# Patient Record
Sex: Female | Born: 1945
Health system: Southern US, Community
[De-identification: ages and names within clinical notes are randomized; demographics above are authoritative.]

## PROBLEM LIST (undated history)

## (undated) DIAGNOSIS — M1990 Unspecified osteoarthritis, unspecified site: Secondary | ICD-10-CM

## (undated) DIAGNOSIS — M8580 Other specified disorders of bone density and structure, unspecified site: Secondary | ICD-10-CM

## (undated) DIAGNOSIS — F419 Anxiety disorder, unspecified: Secondary | ICD-10-CM

## (undated) DIAGNOSIS — I493 Ventricular premature depolarization: Secondary | ICD-10-CM

## (undated) DIAGNOSIS — I10 Essential (primary) hypertension: Secondary | ICD-10-CM

## (undated) DIAGNOSIS — E559 Vitamin D deficiency, unspecified: Secondary | ICD-10-CM

## (undated) DIAGNOSIS — R011 Cardiac murmur, unspecified: Secondary | ICD-10-CM

## (undated) HISTORY — DX: Vitamin D deficiency, unspecified: E55.9

## (undated) HISTORY — DX: Unspecified osteoarthritis, unspecified site: M19.90

## (undated) HISTORY — DX: Cardiac murmur, unspecified: R01.1

## (undated) HISTORY — PX: AUGMENTATION MAMMAPLASTY: SUR837

## (undated) HISTORY — PX: PLACEMENT OF BREAST IMPLANTS: SHX6334

## (undated) HISTORY — DX: Essential (primary) hypertension: I10

## (undated) HISTORY — DX: Ventricular premature depolarization: I49.3

## (undated) HISTORY — DX: Other specified disorders of bone density and structure, unspecified site: M85.80

---

## 1998-02-25 ENCOUNTER — Other Ambulatory Visit: Admission: RE | Admit: 1998-02-25 | Discharge: 1998-02-25 | Payer: Self-pay | Admitting: Obstetrics & Gynecology

## 1998-11-30 ENCOUNTER — Encounter: Payer: Self-pay | Admitting: Obstetrics & Gynecology

## 1998-11-30 ENCOUNTER — Ambulatory Visit (HOSPITAL_COMMUNITY): Admission: RE | Admit: 1998-11-30 | Discharge: 1998-11-30 | Payer: Self-pay | Admitting: Obstetrics & Gynecology

## 1999-03-08 ENCOUNTER — Other Ambulatory Visit: Admission: RE | Admit: 1999-03-08 | Discharge: 1999-03-08 | Payer: Self-pay | Admitting: Obstetrics & Gynecology

## 1999-10-25 HISTORY — PX: OTHER SURGICAL HISTORY: SHX169

## 1999-10-25 HISTORY — PX: FACIAL COSMETIC SURGERY: SHX629

## 1999-12-02 ENCOUNTER — Ambulatory Visit (HOSPITAL_COMMUNITY): Admission: RE | Admit: 1999-12-02 | Discharge: 1999-12-02 | Payer: Self-pay | Admitting: Obstetrics & Gynecology

## 1999-12-02 ENCOUNTER — Encounter: Payer: Self-pay | Admitting: Obstetrics & Gynecology

## 2000-04-14 ENCOUNTER — Other Ambulatory Visit: Admission: RE | Admit: 2000-04-14 | Discharge: 2000-04-14 | Payer: Self-pay | Admitting: Obstetrics & Gynecology

## 2000-06-02 ENCOUNTER — Encounter (INDEPENDENT_AMBULATORY_CARE_PROVIDER_SITE_OTHER): Payer: Self-pay | Admitting: Specialist

## 2000-06-02 ENCOUNTER — Other Ambulatory Visit: Admission: RE | Admit: 2000-06-02 | Discharge: 2000-06-02 | Payer: Self-pay | Admitting: Plastic Surgery

## 2001-03-02 ENCOUNTER — Ambulatory Visit (HOSPITAL_COMMUNITY): Admission: RE | Admit: 2001-03-02 | Discharge: 2001-03-02 | Payer: Self-pay | Admitting: Obstetrics & Gynecology

## 2001-03-02 ENCOUNTER — Encounter: Payer: Self-pay | Admitting: Obstetrics & Gynecology

## 2001-05-09 ENCOUNTER — Other Ambulatory Visit: Admission: RE | Admit: 2001-05-09 | Discharge: 2001-05-09 | Payer: Self-pay | Admitting: Obstetrics & Gynecology

## 2002-03-05 ENCOUNTER — Ambulatory Visit (HOSPITAL_COMMUNITY): Admission: RE | Admit: 2002-03-05 | Discharge: 2002-03-05 | Payer: Self-pay | Admitting: Obstetrics & Gynecology

## 2002-03-05 ENCOUNTER — Encounter: Payer: Self-pay | Admitting: Obstetrics & Gynecology

## 2002-06-05 ENCOUNTER — Other Ambulatory Visit: Admission: RE | Admit: 2002-06-05 | Discharge: 2002-06-05 | Payer: Self-pay | Admitting: Obstetrics & Gynecology

## 2003-03-05 ENCOUNTER — Ambulatory Visit (HOSPITAL_COMMUNITY): Admission: RE | Admit: 2003-03-05 | Discharge: 2003-03-05 | Payer: Self-pay | Admitting: Gastroenterology

## 2003-03-07 ENCOUNTER — Encounter: Payer: Self-pay | Admitting: Obstetrics & Gynecology

## 2003-03-07 ENCOUNTER — Ambulatory Visit (HOSPITAL_COMMUNITY): Admission: RE | Admit: 2003-03-07 | Discharge: 2003-03-07 | Payer: Self-pay | Admitting: Obstetrics & Gynecology

## 2003-03-13 ENCOUNTER — Encounter: Admission: RE | Admit: 2003-03-13 | Discharge: 2003-03-13 | Payer: Self-pay | Admitting: Obstetrics & Gynecology

## 2003-03-13 ENCOUNTER — Encounter: Payer: Self-pay | Admitting: Obstetrics & Gynecology

## 2003-08-04 ENCOUNTER — Other Ambulatory Visit: Admission: RE | Admit: 2003-08-04 | Discharge: 2003-08-04 | Payer: Self-pay | Admitting: Obstetrics & Gynecology

## 2004-04-05 ENCOUNTER — Encounter: Admission: RE | Admit: 2004-04-05 | Discharge: 2004-04-05 | Payer: Self-pay | Admitting: Obstetrics & Gynecology

## 2004-09-06 ENCOUNTER — Other Ambulatory Visit: Admission: RE | Admit: 2004-09-06 | Discharge: 2004-09-06 | Payer: Self-pay | Admitting: Obstetrics & Gynecology

## 2004-11-04 ENCOUNTER — Ambulatory Visit (HOSPITAL_COMMUNITY): Admission: RE | Admit: 2004-11-04 | Discharge: 2004-11-04 | Payer: Self-pay | Admitting: Obstetrics & Gynecology

## 2004-11-04 ENCOUNTER — Encounter (INDEPENDENT_AMBULATORY_CARE_PROVIDER_SITE_OTHER): Payer: Self-pay | Admitting: *Deleted

## 2005-04-07 ENCOUNTER — Encounter: Admission: RE | Admit: 2005-04-07 | Discharge: 2005-04-07 | Payer: Self-pay | Admitting: Obstetrics & Gynecology

## 2005-10-03 ENCOUNTER — Other Ambulatory Visit: Admission: RE | Admit: 2005-10-03 | Discharge: 2005-10-03 | Payer: Self-pay | Admitting: Obstetrics & Gynecology

## 2006-04-12 ENCOUNTER — Encounter: Admission: RE | Admit: 2006-04-12 | Discharge: 2006-04-12 | Payer: Self-pay | Admitting: Obstetrics & Gynecology

## 2007-05-17 ENCOUNTER — Encounter: Admission: RE | Admit: 2007-05-17 | Discharge: 2007-05-17 | Payer: Self-pay | Admitting: Obstetrics & Gynecology

## 2008-05-19 ENCOUNTER — Encounter: Admission: RE | Admit: 2008-05-19 | Discharge: 2008-05-19 | Payer: Self-pay | Admitting: Obstetrics & Gynecology

## 2009-05-21 ENCOUNTER — Encounter: Admission: RE | Admit: 2009-05-21 | Discharge: 2009-05-21 | Payer: Self-pay | Admitting: Obstetrics & Gynecology

## 2010-11-04 ENCOUNTER — Encounter
Admission: RE | Admit: 2010-11-04 | Discharge: 2010-11-04 | Payer: Self-pay | Source: Home / Self Care | Attending: Obstetrics & Gynecology | Admitting: Obstetrics & Gynecology

## 2010-11-14 ENCOUNTER — Encounter: Payer: Self-pay | Admitting: Obstetrics & Gynecology

## 2011-03-11 NOTE — Op Note (Signed)
   NAME:  Karina Morris, Karina Morris                          ACCOUNT NO.:  [id number]   MEDICAL RECORD NO.:  [id number]                   PATIENT TYPE:  AMB   LOCATION:  ENDO                                 FACILITY:  [facility]   PHYSICIAN:  Graylin Shiver, M.D.                DATE OF BIRTH:  07-Feb-1946   DATE OF PROCEDURE:  DATE OF DISCHARGE:                                 OPERATIVE REPORT   Colonoscopy   INDICATIONS FOR PROCEDURE:  Screening for colon cancer.   Informed consent was obtained.   PREMEDICATION:  1. Fentanyl 100 mcg IV  2. Versed 10 mg IV, total dose given during the procedure.   PROCEDURE:  With the patient in the left lateral decubitus position, a  rectal exam was performed.  No masses were felt.  Some small external  hemorrhoidal tags were noted.  The Olympus colonoscope was inserted into the  rectum and advanced around the colon to the cecum.  Cecal landmarks were  identified.  The cecum looked normal.  There was an occasional diverticulum  noted in the ascending colon and transverse colon; otherwise, no  abnormalities were seen.  Moderate diverticulosis was noted in the  descending colon and sigmoid.  The rectum was normal.  She tolerated the  procedure well without complications.   IMPRESSION:  Diverticulosis.   Patient was returned to the recovery area at end of endoscopy.  I would  recommend a followup screening colonoscopy again in 10 years.                                               Graylin Shiver, M.D.    SFG/MEDQ  D:  03/05/2003  T:  03/06/2003  Job:  [id number]   cc:   Duncan Dull, M.D.  [address]  [place]  [place] [address]  Fax: [phone]

## 2011-03-11 NOTE — Op Note (Signed)
NAME:  Karina Morris, Karina Morris NO.:  [id number]   MEDICAL RECORD NO.:  [id number]          PATIENT TYPE:  AMB   LOCATION:  SDC                           FACILITY:  [facility]   PHYSICIAN:  Freddy Finner, M.D.   DATE OF BIRTH:  1946/03/25   DATE OF PROCEDURE:  DATE OF DISCHARGE:                                 OPERATIVE REPORT   PREOPERATIVE DIAGNOSIS:  Postmenopausal bleeding, endometrial polyp.   POSTOPERATIVE DIAGNOSIS:  Postmenopausal bleeding, endometrial polyp, with a  fundal polyp and two webs of tissue consistent with possible intracavitary  scarring.   OPERATIVE PROCEDURE:  Hysteroscopy, D&C, with resection of scarring and  polyp.   SURGEON:  Freddy Finner, M.D.   ANESTHESIA:  Managed intravenous sedation and paracervical block.   INTRAOPERATIVE COMPLICATIONS:  None.   ESTIMATED INTRAOPERATIVE BLOOD LOSS:  Less than or equal to 10 cc.   INTRAOPERATIVE SORBITOL DEFICIT:  30 cc.   The patient is a 65 year old who had an episode of postmenopausal bleeding  and had a sonohysterogram in the office showing an endometrial polyp.  She  was admitted at this time for hysteroscopy and D&C.  She was admitted on the  morning of surgery.  She was brought to the operating room and there placed  under adequate intravenous sedation and placed in the dorsal lithotomy  position.  Betadine prep was carried out in the usual fashion.  Sterile  drapes were applied.  Bivalve speculum was introduced.  Cervix was  visualized and grasped on the anterior lip with a single-tooth tenaculum.  Paracervical block was placed using 10 cc of 1% plain Xylocaine.  Injections  were made at 4 and 8 o'clock in the vaginal fornices.  Cervix was  progressively dilated to 23 with Pratts.  Uterus and cervix sounded to 8.5  cm.  The 12-1/2 degree _________ hysteroscope was introduced, and  visualization revealed findings as noted above.  These findings are recorded  in still photographs which are retained  in the office record.  Gentle  thorough curettage was carried out, followed by exploration with Randall  stone forceps.  Repeat inspection revealed near complete resection of the  lesions noted above.  A small area was still remaining on the right.  This  was recuretted with success.  After completing the procedure, reinspection  was carried out.  The procedure was then terminated.  All instruments were  removed, and the patient was taken to the recovery room in good condition.  She was given Darvocet to be taken as needed for postoperative pain  unrelieved by ibuprofen or Tylenol.  She was given 30 mg of Toradol IV and  30 IM immediately after conclusion of the procedure.  She was to be given  routine outpatient surgical instructions, and is to follow up in the office  in 7-10 days.     Hosie Spangle  WRN/MEDQ  D:  11/04/2004  T:  11/04/2004  Job:  [id number]

## 2011-10-12 ENCOUNTER — Other Ambulatory Visit: Payer: Self-pay | Admitting: Obstetrics & Gynecology

## 2011-10-12 DIAGNOSIS — Z1231 Encounter for screening mammogram for malignant neoplasm of breast: Secondary | ICD-10-CM

## 2011-11-07 ENCOUNTER — Ambulatory Visit
Admission: RE | Admit: 2011-11-07 | Discharge: 2011-11-07 | Disposition: A | Discharge status: Home / Self Care | Payer: Medicare Other | Source: Ambulatory Visit | Attending: Obstetrics & Gynecology | Admitting: Obstetrics & Gynecology

## 2011-11-07 DIAGNOSIS — Z1231 Encounter for screening mammogram for malignant neoplasm of breast: Secondary | ICD-10-CM

## 2011-12-12 DIAGNOSIS — M810 Age-related osteoporosis without current pathological fracture: Secondary | ICD-10-CM | POA: Diagnosis not present

## 2011-12-12 DIAGNOSIS — N951 Menopausal and female climacteric states: Secondary | ICD-10-CM | POA: Diagnosis not present

## 2011-12-12 DIAGNOSIS — Z130 Encounter for screening for diseases of the blood and blood-forming organs and certain disorders involving the immune mechanism: Secondary | ICD-10-CM | POA: Diagnosis not present

## 2011-12-12 DIAGNOSIS — Z124 Encounter for screening for malignant neoplasm of cervix: Secondary | ICD-10-CM | POA: Diagnosis not present

## 2012-01-23 ENCOUNTER — Encounter: Payer: Self-pay | Admitting: Internal Medicine

## 2012-01-24 ENCOUNTER — Encounter: Payer: Self-pay | Admitting: Internal Medicine

## 2012-03-02 ENCOUNTER — Other Ambulatory Visit: Payer: Self-pay | Admitting: Internal Medicine

## 2012-03-02 ENCOUNTER — Other Ambulatory Visit: Payer: Medicare Other | Admitting: Internal Medicine

## 2012-03-13 ENCOUNTER — Other Ambulatory Visit: Payer: Medicare Other | Admitting: Internal Medicine

## 2012-07-24 DIAGNOSIS — Z23 Encounter for immunization: Secondary | ICD-10-CM | POA: Diagnosis not present

## 2012-09-03 DIAGNOSIS — S61209A Unspecified open wound of unspecified finger without damage to nail, initial encounter: Secondary | ICD-10-CM | POA: Diagnosis not present

## 2012-09-03 DIAGNOSIS — L089 Local infection of the skin and subcutaneous tissue, unspecified: Secondary | ICD-10-CM | POA: Diagnosis not present

## 2012-09-03 DIAGNOSIS — S60469A Insect bite (nonvenomous) of unspecified finger, initial encounter: Secondary | ICD-10-CM | POA: Diagnosis not present

## 2012-10-02 ENCOUNTER — Other Ambulatory Visit: Payer: Self-pay | Admitting: Obstetrics & Gynecology

## 2012-10-02 DIAGNOSIS — Z9882 Breast implant status: Secondary | ICD-10-CM

## 2012-10-02 DIAGNOSIS — Z1231 Encounter for screening mammogram for malignant neoplasm of breast: Secondary | ICD-10-CM

## 2012-10-22 ENCOUNTER — Encounter: Payer: Self-pay | Admitting: Internal Medicine

## 2012-10-29 DIAGNOSIS — H2510 Age-related nuclear cataract, unspecified eye: Secondary | ICD-10-CM | POA: Diagnosis not present

## 2012-11-09 ENCOUNTER — Ambulatory Visit (AMBULATORY_SURGERY_CENTER): Payer: Medicare Other | Admitting: *Deleted

## 2012-11-09 VITALS — Ht 63.5 in | Wt 160.2 lb

## 2012-11-09 DIAGNOSIS — Z1211 Encounter for screening for malignant neoplasm of colon: Secondary | ICD-10-CM

## 2012-11-09 MED ORDER — NA SULFATE-K SULFATE-MG SULF 17.5-3.13-1.6 GM/177ML PO SOLN: ORAL | Status: DC

## 2012-11-13 ENCOUNTER — Ambulatory Visit
Admission: RE | Admit: 2012-11-13 | Discharge: 2012-11-13 | Disposition: A | Discharge status: Home / Self Care | Payer: Medicare Other | Source: Ambulatory Visit | Attending: Obstetrics & Gynecology | Admitting: Obstetrics & Gynecology

## 2012-11-13 DIAGNOSIS — Z9882 Breast implant status: Secondary | ICD-10-CM

## 2012-11-13 DIAGNOSIS — Z1231 Encounter for screening mammogram for malignant neoplasm of breast: Secondary | ICD-10-CM

## 2012-11-23 ENCOUNTER — Ambulatory Visit (AMBULATORY_SURGERY_CENTER): Payer: Medicare Other | Admitting: Internal Medicine

## 2012-11-23 ENCOUNTER — Encounter: Payer: Self-pay | Admitting: Internal Medicine

## 2012-11-23 VITALS — BP 159/65 | HR 79 | Temp 98.2°F | Resp 15 | Ht 63.5 in | Wt 160.0 lb

## 2012-11-23 DIAGNOSIS — K5730 Diverticulosis of large intestine without perforation or abscess without bleeding: Secondary | ICD-10-CM

## 2012-11-23 DIAGNOSIS — Z1211 Encounter for screening for malignant neoplasm of colon: Secondary | ICD-10-CM

## 2012-11-23 MED ORDER — SODIUM CHLORIDE 0.9 % IV SOLN: 500.0000 mL | INTRAVENOUS | Status: DC

## 2012-11-23 NOTE — Progress Notes (Signed)
Patient did not experience any of the following events: a burn prior to discharge; a fall within the facility; wrong site/side/patient/procedure/implant event; or a hospital transfer or hospital admission upon discharge from the facility. (G8907) Patient did not have preoperative order for IV antibiotic SSI prophylaxis. (G8918)  

## 2012-11-23 NOTE — Patient Instructions (Addendum)
No polyps or cancer seen. You do have diverticulosis throughout the colon - this usually does not cause problems for people and is not something that pre-disposes to cancer.  You can consider a repeat colonoscopy in 10 years (2024).  Thank you for choosing me and [facility].  Iva Boop, MD, [facility]     You may resume your current medications today.  Please call if any questions or concerns.   YOU HAD AN ENDOSCOPIC PROCEDURE TODAY AT THE [facility]: Refer to the procedure report that was given to you for any specific questions about what was found during the examination.  If the procedure report does not answer your questions, please call your gastroenterologist to clarify.  If you requested that your care partner not be given the details of your procedure findings, then the procedure report has been included in a sealed envelope for you to review at your convenience later.  YOU SHOULD EXPECT: Some feelings of bloating in the abdomen. Passage of more gas than usual.  Walking can help get rid of the air that was put into your GI tract during the procedure and reduce the bloating. If you had a lower endoscopy (such as a colonoscopy or flexible sigmoidoscopy) you may notice spotting of blood in your stool or on the toilet paper. If you underwent a bowel prep for your procedure, then you may not have a normal bowel movement for a few days.  DIET: Your first meal following the procedure should be a light meal and then it is ok to progress to your normal diet.  A half-sandwich or bowl of soup is an example of a good first meal.  Heavy or fried foods are harder to digest and may make you feel nauseous or bloated.  Likewise meals heavy in dairy and vegetables can cause extra gas to form and this can also increase the bloating.  Drink plenty of fluids but you should avoid alcoholic beverages for 24 hours.  ACTIVITY: Your care partner should take you home directly after the  procedure.  You should plan to take it easy, moving slowly for the rest of the day.  You can resume normal activity the day after the procedure however you should NOT DRIVE or use heavy machinery for 24 hours (because of the sedation medicines used during the test).    SYMPTOMS TO REPORT IMMEDIATELY: A gastroenterologist can be reached at any hour.  During normal business hours, 8:30 AM to 5:00 PM Monday through Friday, call [phone].  After hours and on weekends, please call the GI answering service at [phone] who will take a message and have the physician on call contact you.   Following lower endoscopy (colonoscopy or flexible sigmoidoscopy):  Excessive amounts of blood in the stool  Significant tenderness or worsening of abdominal pains  Swelling of the abdomen that is new, acute  Fever of 100F or higher    FOLLOW UP: If any biopsies were taken you will be contacted by phone or by letter within the next 1-3 weeks.  Call your gastroenterologist if you have not heard about the biopsies in 3 weeks.  Our staff will call the home number listed on your records the next business day following your procedure to check on you and address any questions or concerns that you may have at that time regarding the information given to you following your procedure. This is a courtesy call and so if there is no answer at the home  number and we have not heard from you through the emergency physician on call, we will assume that you have returned to your regular daily activities without incident.  SIGNATURES/CONFIDENTIALITY: You and/or your care partner have signed paperwork which will be entered into your electronic medical record.  These signatures attest to the fact that that the information above on your After Visit Summary has been reviewed and is understood.  Full responsibility of the confidentiality of this discharge information lies with you and/or your care-partner.

## 2012-11-23 NOTE — Progress Notes (Signed)
Pt stable to RR 

## 2012-11-23 NOTE — Op Note (Signed)
[facility] [address] [place], [address]   COLONOSCOPY PROCEDURE REPORT  PATIENT: Karina Morris, Karina Morris  MR#: [id number] BIRTHDATE: Sep 26, 1946 , 66  yrs. old GENDER: Female ENDOSCOPIST: Iva Boop, MD, [facility] REFERRED AO:ZHYQMV Jennette Kettle, M.D. PROCEDURE DATE:  11/23/2012 PROCEDURE:   Colonoscopy, screening ASA CLASS:   Class I INDICATIONS:average risk screening. MEDICATIONS: propofol (Diprivan) 300mg  IV, MAC sedation, administered by CRNA, and These medications were titrated to patient response per physician's verbal order  DESCRIPTION OF PROCEDURE:   After the risks benefits and alternatives of the procedure were thoroughly explained, informed consent was obtained.  A digital rectal exam revealed no abnormalities of the rectum.   The LB [id number]-[id number]  endoscope was introduced through the anus and advanced to the cecum, which was identified by both the appendix and ileocecal valve. No adverse events experienced.   The quality of the prep was Suprep excellent The instrument was then slowly withdrawn as the colon was fully examined.      COLON FINDINGS: There was severe diverticulosis noted throughout the entire examined colon with associated muscular hypertrophy and luminal narrowing.   The colon mucosa was otherwise normal.   A right colon retroflexion was performed.  Retroflexed views revealed no abnormalities. The time to cecum=4 minutes 14 seconds. Withdrawal time=7 minutes 08 seconds.  The scope was withdrawn and the procedure completed. COMPLICATIONS: There were no complications.  ENDOSCOPIC IMPRESSION: 1.   There was severe diverticulosis noted throughout the entire examined colon 2.   Normal colonoscopy otherwise - excellent prep  RECOMMENDATIONS: Consider repeat Colonscopy in 10 years at age 78 - no automatic recall - can discuss with PCP   eSigned:  Iva Boop, MD, [facility] 11/23/2012 11:43 AM   cc: Varney Baas, MD and The  Patient

## 2012-11-23 NOTE — Progress Notes (Signed)
No complaints noted in the recovery room. Maw   

## 2012-11-23 NOTE — Progress Notes (Signed)
No egg or soy allergy. ewm 

## 2012-11-26 ENCOUNTER — Telehealth: Payer: Self-pay | Admitting: *Deleted

## 2012-11-26 NOTE — Telephone Encounter (Signed)
  Follow up Call-  Call back number 11/23/2012  Post procedure Call Back phone  # [phone]  Permission to leave phone message Yes     Patient questions:  Do you have a fever, pain , or abdominal swelling? no Pain Score  0 *  Have you tolerated food without any problems? yes  Have you been able to return to your normal activities? yes  Do you have any questions about your discharge instructions: Diet   no Medications  no Follow up visit  no  Do you have questions or concerns about your Care? no  Actions: * If pain score is 4 or above: No action needed, pain <4.

## 2012-12-31 DIAGNOSIS — Z130 Encounter for screening for diseases of the blood and blood-forming organs and certain disorders involving the immune mechanism: Secondary | ICD-10-CM | POA: Diagnosis not present

## 2012-12-31 DIAGNOSIS — Z01419 Encounter for gynecological examination (general) (routine) without abnormal findings: Secondary | ICD-10-CM | POA: Diagnosis not present

## 2013-03-17 ENCOUNTER — Emergency Department (HOSPITAL_COMMUNITY)
Admission: EM | Admit: 2013-03-17 | Discharge: 2013-03-17 | Disposition: A | Discharge status: Home / Self Care | Payer: Medicare Other | Attending: Emergency Medicine | Admitting: Emergency Medicine

## 2013-03-17 ENCOUNTER — Encounter (HOSPITAL_COMMUNITY): Payer: Self-pay | Admitting: Emergency Medicine

## 2013-03-17 ENCOUNTER — Emergency Department (HOSPITAL_COMMUNITY): Payer: Medicare Other

## 2013-03-17 DIAGNOSIS — R109 Unspecified abdominal pain: Secondary | ICD-10-CM | POA: Diagnosis not present

## 2013-03-17 DIAGNOSIS — Z79899 Other long term (current) drug therapy: Secondary | ICD-10-CM | POA: Insufficient documentation

## 2013-03-17 DIAGNOSIS — M129 Arthropathy, unspecified: Secondary | ICD-10-CM | POA: Insufficient documentation

## 2013-03-17 DIAGNOSIS — N201 Calculus of ureter: Secondary | ICD-10-CM | POA: Diagnosis not present

## 2013-03-17 DIAGNOSIS — Z8719 Personal history of other diseases of the digestive system: Secondary | ICD-10-CM | POA: Diagnosis not present

## 2013-03-17 DIAGNOSIS — K8020 Calculus of gallbladder without cholecystitis without obstruction: Secondary | ICD-10-CM | POA: Diagnosis not present

## 2013-03-17 DIAGNOSIS — N200 Calculus of kidney: Secondary | ICD-10-CM | POA: Diagnosis not present

## 2013-03-17 LAB — CBC WITH DIFFERENTIAL/PLATELET
Basophils Absolute: 0 10*3/uL (ref 0.0–0.1)
Basophils Relative: 0 % (ref 0–1)
HCT: 36.7 % (ref 36.0–46.0)
Lymphocytes Relative: 20 % (ref 12–46)
MCHC: 34.1 g/dL (ref 30.0–36.0)
Monocytes Absolute: 0.5 10*3/uL (ref 0.1–1.0)
Neutro Abs: 6.5 10*3/uL (ref 1.7–7.7)
Neutrophils Relative %: 74 % (ref 43–77)
Platelets: 185 10*3/uL (ref 150–400)
RDW: 13.3 % (ref 11.5–15.5)
WBC: 8.8 10*3/uL (ref 4.0–10.5)

## 2013-03-17 LAB — URINALYSIS, ROUTINE W REFLEX MICROSCOPIC
Nitrite: NEGATIVE
Urobilinogen, UA: 0.2 mg/dL (ref 0.0–1.0)
pH: 5.5 (ref 5.0–8.0)

## 2013-03-17 LAB — POCT I-STAT, CHEM 8
BUN: 24 mg/dL — ABNORMAL HIGH (ref 6–23)
Calcium, Ion: 1.21 mmol/L (ref 1.13–1.30)
Chloride: 108 mEq/L (ref 96–112)
HCT: 39 % (ref 36.0–46.0)
Potassium: 3.8 mEq/L (ref 3.5–5.1)
Sodium: 141 mEq/L (ref 135–145)

## 2013-03-17 LAB — URINE MICROSCOPIC-ADD ON

## 2013-03-17 MED ORDER — TAMSULOSIN HCL 0.4 MG PO CAPS: 0.4000 mg | ORAL_CAPSULE | Freq: Once | ORAL | Status: DC

## 2013-03-17 MED ORDER — HYDROCODONE-ACETAMINOPHEN 5-325 MG PO TABS: 1.0000 | ORAL_TABLET | Freq: Four times a day (QID) | ORAL | Status: DC | PRN

## 2013-03-17 MED ORDER — MORPHINE SULFATE 4 MG/ML IJ SOLN
4.0000 mg | INTRAMUSCULAR | Status: DC | PRN
  Administered 2013-03-17: 4 mg via INTRAVENOUS
  Filled 2013-03-17 (×2): qty 1

## 2013-03-17 MED ORDER — ONDANSETRON HCL 4 MG/2ML IJ SOLN
4.0000 mg | Freq: Four times a day (QID) | INTRAMUSCULAR | Status: DC | PRN
  Administered 2013-03-17: 4 mg via INTRAVENOUS
  Filled 2013-03-17 (×2): qty 2

## 2013-03-17 MED ORDER — ONDANSETRON 8 MG PO TBDP: 8.0000 mg | ORAL_TABLET | Freq: Three times a day (TID) | ORAL | Status: DC | PRN

## 2013-03-17 NOTE — ED Provider Notes (Signed)
History     CSN: [id number]  Arrival date & time 03/17/13  1725   First MD Initiated Contact with Patient 03/17/13 1750      Chief Complaint  Patient presents with  . Flank Pain    (Consider location/radiation/quality/duration/timing/severity/associated sxs/prior treatment) HPI Comments: Pt comes in with cc of flank pain. Pt has no hx of renal stones. States that patient started having pain on the left flank side yday. The pain is sharp, and at par to her child bearing. The pain is intermittent, and non radiating. There is no n/v/f/c. No vaginal bleeding, discharge. Pt has hx of diverticular dz - but no anterior pain.  Patient is a 67 y.o. female presenting with flank pain. The history is provided by the patient.  Flank Pain Pertinent negatives include no chest pain, no abdominal pain and no shortness of breath.    Past Medical History  Diagnosis Date  . Arthritis     neck    Past Surgical History  Procedure Laterality Date  . Facial cosmetic surgery  2001  . Bilateral breast reduction  2001    Family History  Problem Relation Age of Onset  . Colon cancer Neg Hx   . Stomach cancer Neg Hx     History  Substance Use Topics  . Smoking status: Never Smoker   . Smokeless tobacco: Never Used  . Alcohol Use: 0.6 oz/week    1 Glasses of wine per week    OB History   Grav Para Term Preterm Abortions TAB SAB Ect Mult Living                  Review of Systems  Constitutional: Negative for activity change.  HENT: Negative for facial swelling and neck pain.   Respiratory: Negative for cough, shortness of breath and wheezing.   Cardiovascular: Negative for chest pain.  Gastrointestinal: Negative for nausea, vomiting, abdominal pain, diarrhea, constipation, blood in stool and abdominal distention.  Genitourinary: Positive for flank pain. Negative for hematuria and difficulty urinating.  Skin: Negative for color change.  Neurological: Negative for speech difficulty.   Hematological: Does not bruise/bleed easily.  Psychiatric/Behavioral: Negative for confusion.    Allergies  Review of patient's allergies indicates no known allergies.  Home Medications   Current Outpatient Rx  Name  Route  Sig  Dispense  Refill  . calcium carbonate (OS-CAL) 600 MG TABS   Oral   Take 600 mg by mouth daily.         . Cholecalciferol (VITAMIN D) 2000 UNITS CAPS   Oral   Take 1 capsule by mouth daily.          Marland Kitchen estrogens, conjugated, (PREMARIN) 0.45 MG tablet   Oral   Take 0.45 mg by mouth daily. Continuous over 30 days         . Lysine 500 MG CAPS   Oral   Take 1 capsule by mouth daily.         . progesterone (PROMETRIUM) 200 MG capsule   Oral   Take 200 mg by mouth daily. Take 1 tablet daily for 12 days once every 3 months, on days 1st -12 th of the month         . zolpidem (AMBIEN) 10 MG tablet   Oral   Take 10 mg by mouth at bedtime as needed for sleep.           BP 177/65  Pulse 109  Temp(Src) 98.5 F (36.9 C) (Oral)  SpO2 100%  Physical Exam  Constitutional: She is oriented to person, place, and time. She appears well-developed and well-nourished.  HENT:  Head: Normocephalic and atraumatic.  Eyes: EOM are normal. Pupils are equal, round, and reactive to light.  Neck: Neck supple.  Cardiovascular: Normal rate, regular rhythm and normal heart sounds.   No murmur heard. Pulmonary/Chest: Effort normal. No respiratory distress.  Abdominal: Soft. She exhibits no distension. There is no tenderness. There is no rebound and no guarding.  Neurological: She is alert and oriented to person, place, and time.  Skin: Skin is warm and dry.    ED Course  Procedures (including critical care time)  Labs Reviewed  URINALYSIS, ROUTINE W REFLEX MICROSCOPIC - Abnormal; Notable for the following:    APPearance CLOUDY (*)    Hgb urine dipstick SMALL (*)    Leukocytes, UA SMALL (*)    All other components within normal limits  URINE  MICROSCOPIC-ADD ON - Abnormal; Notable for the following:    Bacteria, UA FEW (*)    All other components within normal limits  POCT I-STAT, CHEM 8 - Abnormal; Notable for the following:    BUN 24 (*)    Creatinine, Ser 1.60 (*)    Glucose, Bld 109 (*)    All other components within normal limits  CBC WITH DIFFERENTIAL   Ct Abdomen Pelvis Wo Contrast  03/17/2013   *RADIOLOGY REPORT*  Clinical Data: Left flank pain.  Pain in the perineum.  Pain comes and goes.  Urge to urinate.  CT ABDOMEN AND PELVIS WITHOUT CONTRAST  Technique:  Multidetector CT imaging of the abdomen and pelvis was performed following the standard protocol without intravenous contrast.  Comparison: None.  Findings: The patient has bilateral breast implants.  Lung bases are unremarkable.  The within the distal left ureter, there are numerous small stones, largest measuring approximately 4 mm in diameter.  The appearance is known as "steinstrasse".  There is resulting mild left hydronephrosis and perinephric stranding.  Small intrarenal calculi are also identified in the left kidney, largest measuring approximate 4 mm in diameter.  On the right, there is an extrarenal pelvis, containing 2 calculi, 8 and 5 mm each.  However, these do not appear be causing obstruction.  The liver is fatty without obvious focal lesion.  No focal abnormality identified within the spleen, pancreas, or adrenal glands.  The gallbladder is present, containing high attenuation material consistent with stones.  The stomach and small bowel loops are normal appearance. The appendix is well seen and has a normal appearance.  Throughout the colon, there are numerous diverticula.  No definite evidence for acute diverticulitis.  The uterus is present.  Left adnexal low attenuation lesion it is 3.9 x 3.4 cm.  Further evaluation with pelvic ultrasound is suggested for characterization.  No free pelvic fluid or pelvic adenopathy.  Mild degenerative changes are seen in the  lower lumbar spine.  No suspicious lytic or blastic lesions are identified.  IMPRESSION:  1.  Multiple stones in the distal left ureter, measuring up to 4 mm in greatest diameter. 2.  Nonobstructing stones within the right extrarenal pelvis. 3.  Intrarenal stones in the left kidney. 4.  Left adnexal low density lesion, 3.9 cm warrants further evaluation with pelvic ultrasound. This could be performed on a non emergent, outpatient basis as needed.  5.  Gallstones. 6.  Diverticulosis.   Original Report Authenticated By: Norva Pavlov, M.D.     No diagnosis found.    MDM  Pt comes in with cc of intermittent flank pain. Clinical hx is suggestive of non-obstructive renal stones - but diverticulitis is also possible and so is pyelonephritis.  Will get CT renal stone protocol. Will treat the sx.  8:26 PM Pt is pain free. CT results shared. Asked her to get non emergent [place] as per recs. Urology f/u recommended, Return precautions discussed.   Derwood Kaplan, MD 03/17/13 2026

## 2013-03-17 NOTE — ED Notes (Signed)
Pt states that Friday she was cleaning out her garage and afterwards she started having L flank pain. States that she also had a pain in her perineum area. Pain has come and gone and she has also had urge to pee with out ability to do so. States she went and got overactive bladder 'patch' from [facility] that helped her pass urine. No hx of kidney stones or bladder problems.

## 2013-06-03 DIAGNOSIS — N949 Unspecified condition associated with female genital organs and menstrual cycle: Secondary | ICD-10-CM | POA: Diagnosis not present

## 2013-07-22 DIAGNOSIS — Z23 Encounter for immunization: Secondary | ICD-10-CM | POA: Diagnosis not present

## 2013-11-04 ENCOUNTER — Other Ambulatory Visit: Payer: Self-pay

## 2013-11-04 DIAGNOSIS — Z1231 Encounter for screening mammogram for malignant neoplasm of breast: Secondary | ICD-10-CM

## 2013-11-15 ENCOUNTER — Other Ambulatory Visit: Payer: Self-pay

## 2013-11-15 DIAGNOSIS — Z9882 Breast implant status: Secondary | ICD-10-CM

## 2013-11-15 DIAGNOSIS — Z1231 Encounter for screening mammogram for malignant neoplasm of breast: Secondary | ICD-10-CM

## 2013-11-28 ENCOUNTER — Ambulatory Visit
Admission: RE | Admit: 2013-11-28 | Discharge: 2013-11-28 | Disposition: A | Discharge status: Home / Self Care | Payer: Medicare Other | Source: Ambulatory Visit

## 2013-11-28 DIAGNOSIS — Z1231 Encounter for screening mammogram for malignant neoplasm of breast: Secondary | ICD-10-CM

## 2013-11-28 DIAGNOSIS — Z9882 Breast implant status: Secondary | ICD-10-CM

## 2014-02-21 DIAGNOSIS — Z0000 Encounter for general adult medical examination without abnormal findings: Secondary | ICD-10-CM | POA: Diagnosis not present

## 2014-02-21 DIAGNOSIS — E559 Vitamin D deficiency, unspecified: Secondary | ICD-10-CM | POA: Diagnosis not present

## 2014-02-21 DIAGNOSIS — M899 Disorder of bone, unspecified: Secondary | ICD-10-CM | POA: Diagnosis not present

## 2014-02-21 DIAGNOSIS — M949 Disorder of cartilage, unspecified: Secondary | ICD-10-CM | POA: Diagnosis not present

## 2014-02-21 DIAGNOSIS — Z136 Encounter for screening for cardiovascular disorders: Secondary | ICD-10-CM | POA: Diagnosis not present

## 2014-02-21 DIAGNOSIS — M545 Low back pain, unspecified: Secondary | ICD-10-CM | POA: Diagnosis not present

## 2014-02-21 DIAGNOSIS — Z131 Encounter for screening for diabetes mellitus: Secondary | ICD-10-CM | POA: Diagnosis not present

## 2014-03-24 DIAGNOSIS — Z01419 Encounter for gynecological examination (general) (routine) without abnormal findings: Secondary | ICD-10-CM | POA: Diagnosis not present

## 2014-03-24 DIAGNOSIS — Z124 Encounter for screening for malignant neoplasm of cervix: Secondary | ICD-10-CM | POA: Diagnosis not present

## 2014-08-04 DIAGNOSIS — Z23 Encounter for immunization: Secondary | ICD-10-CM | POA: Diagnosis not present

## 2014-09-03 DIAGNOSIS — L309 Dermatitis, unspecified: Secondary | ICD-10-CM | POA: Diagnosis not present

## 2014-09-03 DIAGNOSIS — D485 Neoplasm of uncertain behavior of skin: Secondary | ICD-10-CM | POA: Diagnosis not present

## 2014-10-08 DIAGNOSIS — J069 Acute upper respiratory infection, unspecified: Secondary | ICD-10-CM | POA: Diagnosis not present

## 2014-10-08 DIAGNOSIS — Z1389 Encounter for screening for other disorder: Secondary | ICD-10-CM | POA: Diagnosis not present

## 2014-10-08 DIAGNOSIS — H6980 Other specified disorders of Eustachian tube, unspecified ear: Secondary | ICD-10-CM | POA: Diagnosis not present

## 2014-11-10 ENCOUNTER — Other Ambulatory Visit: Payer: Self-pay

## 2014-11-10 DIAGNOSIS — Z1231 Encounter for screening mammogram for malignant neoplasm of breast: Secondary | ICD-10-CM

## 2014-12-01 ENCOUNTER — Ambulatory Visit
Admission: RE | Admit: 2014-12-01 | Discharge: 2014-12-01 | Disposition: A | Discharge status: Home / Self Care | Payer: Medicare Other | Source: Ambulatory Visit

## 2014-12-01 DIAGNOSIS — Z1231 Encounter for screening mammogram for malignant neoplasm of breast: Secondary | ICD-10-CM

## 2014-12-03 ENCOUNTER — Other Ambulatory Visit: Payer: Self-pay | Admitting: Obstetrics & Gynecology

## 2014-12-03 DIAGNOSIS — R928 Other abnormal and inconclusive findings on diagnostic imaging of breast: Secondary | ICD-10-CM

## 2014-12-10 ENCOUNTER — Ambulatory Visit
Admission: RE | Admit: 2014-12-10 | Discharge: 2014-12-10 | Disposition: A | Discharge status: Home / Self Care | Payer: Medicare Other | Source: Ambulatory Visit | Attending: Obstetrics & Gynecology | Admitting: Obstetrics & Gynecology

## 2014-12-10 DIAGNOSIS — R928 Other abnormal and inconclusive findings on diagnostic imaging of breast: Secondary | ICD-10-CM

## 2014-12-10 DIAGNOSIS — R921 Mammographic calcification found on diagnostic imaging of breast: Secondary | ICD-10-CM | POA: Diagnosis not present

## 2015-02-25 DIAGNOSIS — Z1389 Encounter for screening for other disorder: Secondary | ICD-10-CM | POA: Diagnosis not present

## 2015-02-25 DIAGNOSIS — S46919A Strain of unspecified muscle, fascia and tendon at shoulder and upper arm level, unspecified arm, initial encounter: Secondary | ICD-10-CM | POA: Diagnosis not present

## 2015-02-25 DIAGNOSIS — Z23 Encounter for immunization: Secondary | ICD-10-CM | POA: Diagnosis not present

## 2015-02-25 DIAGNOSIS — Z13220 Encounter for screening for lipoid disorders: Secondary | ICD-10-CM | POA: Diagnosis not present

## 2015-02-25 DIAGNOSIS — Z0000 Encounter for general adult medical examination without abnormal findings: Secondary | ICD-10-CM | POA: Diagnosis not present

## 2015-02-25 DIAGNOSIS — Z131 Encounter for screening for diabetes mellitus: Secondary | ICD-10-CM | POA: Diagnosis not present

## 2015-02-25 DIAGNOSIS — Z136 Encounter for screening for cardiovascular disorders: Secondary | ICD-10-CM | POA: Diagnosis not present

## 2015-03-30 DIAGNOSIS — Z01419 Encounter for gynecological examination (general) (routine) without abnormal findings: Secondary | ICD-10-CM | POA: Diagnosis not present

## 2015-03-30 DIAGNOSIS — Z6827 Body mass index (BMI) 27.0-27.9, adult: Secondary | ICD-10-CM | POA: Diagnosis not present

## 2015-05-07 ENCOUNTER — Other Ambulatory Visit: Payer: Self-pay | Admitting: Obstetrics & Gynecology

## 2015-05-07 DIAGNOSIS — R921 Mammographic calcification found on diagnostic imaging of breast: Secondary | ICD-10-CM

## 2015-06-11 ENCOUNTER — Ambulatory Visit
Admission: RE | Admit: 2015-06-11 | Discharge: 2015-06-11 | Disposition: A | Discharge status: Home / Self Care | Payer: Medicare Other | Source: Ambulatory Visit | Attending: Obstetrics & Gynecology | Admitting: Obstetrics & Gynecology

## 2015-06-11 DIAGNOSIS — R921 Mammographic calcification found on diagnostic imaging of breast: Secondary | ICD-10-CM | POA: Diagnosis not present

## 2015-08-07 DIAGNOSIS — Z23 Encounter for immunization: Secondary | ICD-10-CM | POA: Diagnosis not present

## 2015-09-30 ENCOUNTER — Ambulatory Visit (INDEPENDENT_AMBULATORY_CARE_PROVIDER_SITE_OTHER): Payer: Medicare Other | Admitting: Podiatry

## 2015-09-30 DIAGNOSIS — M792 Neuralgia and neuritis, unspecified: Secondary | ICD-10-CM

## 2015-09-30 DIAGNOSIS — M2010 Hallux valgus (acquired), unspecified foot: Secondary | ICD-10-CM

## 2015-09-30 NOTE — Progress Notes (Signed)
   Subjective:    Patient ID: Karina Morris, female    DOB: Apr 04, 1946, 69 y.o.   MRN: [id number]  HPI this patient presents to the office with chief complaint of severe burning pain noted on the bottom of her second toe, right foot. She states this been going on for approximately 2 months. She says this is an occasional incident that the pain is severe when it occurs. She denies any history or trauma to the foot. She has provided no self treatment or so any professional help. She presents the office for evaluation and treatment of this condition    Review of Systems  All other systems reviewed and are negative.      Objective:   Physical Exam GENERAL APPEARANCE: Alert, conversant. Appropriately groomed. No acute distress.  VASCULAR: Pedal pulses palpable at  [facility] and PT bilateral.  Capillary refill time is immediate to all digits,  Normal temperature gradient.  Digital hair growth is present bilateral  NEUROLOGIC: sensation is normal to 5.07 monofilament at 5/5 sites bilateral.  Light touch is intact bilateral, Muscle strength normal. Unable to elicit pain in the bottom of second toe right foot. MUSCULOSKELETAL: acceptable muscle strength, tone and stability bilateral.  Intrinsic muscluature intact bilateral.  Rectus appearance of foot and digits noted bilateral. Bony prominence at 1st  And 2nd MCJ right foot.  No redeness or swelling or palpable pain.  HAV  B/L.  DERMATOLOGIC: skin color, texture, and turgor are within normal limits.  No preulcerative lesions or ulcers  are seen, no interdigital maceration noted.  No open lesions present.  Digital nails are asymptomatic. No drainage noted.         Assessment & Plan:  Neuritis right foot   Bunions 1st MPJ B/L  IE  Dispense powersteps to be worn in her new SAS shoes. This patient has nerve pain including burning through the bottom of second toe right foot.  This could be caused by deep peroneal nerve running over arthritic changes at the  MCJ right foot.  This nerve can also be irritated due to her bunion function right foot.  Dispensed purestrides to alter her gait.  Told about shoelacing tips right foot.  Gardiner Barefoot DPM

## 2016-03-16 DIAGNOSIS — Z8349 Family history of other endocrine, nutritional and metabolic diseases: Secondary | ICD-10-CM | POA: Diagnosis not present

## 2016-03-16 DIAGNOSIS — E039 Hypothyroidism, unspecified: Secondary | ICD-10-CM | POA: Diagnosis not present

## 2016-03-16 DIAGNOSIS — Z13220 Encounter for screening for lipoid disorders: Secondary | ICD-10-CM | POA: Diagnosis not present

## 2016-03-16 DIAGNOSIS — Z131 Encounter for screening for diabetes mellitus: Secondary | ICD-10-CM | POA: Diagnosis not present

## 2016-03-16 DIAGNOSIS — Z23 Encounter for immunization: Secondary | ICD-10-CM | POA: Diagnosis not present

## 2016-03-16 DIAGNOSIS — Z0000 Encounter for general adult medical examination without abnormal findings: Secondary | ICD-10-CM | POA: Diagnosis not present

## 2016-04-18 DIAGNOSIS — Z124 Encounter for screening for malignant neoplasm of cervix: Secondary | ICD-10-CM | POA: Diagnosis not present

## 2016-04-18 DIAGNOSIS — Z01419 Encounter for gynecological examination (general) (routine) without abnormal findings: Secondary | ICD-10-CM | POA: Diagnosis not present

## 2016-04-18 DIAGNOSIS — Z6828 Body mass index (BMI) 28.0-28.9, adult: Secondary | ICD-10-CM | POA: Diagnosis not present

## 2016-05-17 DIAGNOSIS — G8929 Other chronic pain: Secondary | ICD-10-CM | POA: Diagnosis not present

## 2016-05-17 DIAGNOSIS — Q828 Other specified congenital malformations of skin: Secondary | ICD-10-CM | POA: Diagnosis not present

## 2016-05-17 DIAGNOSIS — M79671 Pain in right foot: Secondary | ICD-10-CM | POA: Diagnosis not present

## 2016-05-17 DIAGNOSIS — M659 Synovitis and tenosynovitis, unspecified: Secondary | ICD-10-CM | POA: Diagnosis not present

## 2016-05-25 ENCOUNTER — Other Ambulatory Visit: Payer: Self-pay | Admitting: Obstetrics & Gynecology

## 2016-05-25 DIAGNOSIS — R921 Mammographic calcification found on diagnostic imaging of breast: Secondary | ICD-10-CM

## 2016-06-09 ENCOUNTER — Ambulatory Visit
Admission: RE | Admit: 2016-06-09 | Discharge: 2016-06-09 | Disposition: A | Discharge status: Home / Self Care | Payer: Medicare Other | Source: Ambulatory Visit | Attending: Obstetrics & Gynecology | Admitting: Obstetrics & Gynecology

## 2016-06-09 DIAGNOSIS — R921 Mammographic calcification found on diagnostic imaging of breast: Secondary | ICD-10-CM | POA: Diagnosis not present

## 2016-06-14 DIAGNOSIS — T7840XA Allergy, unspecified, initial encounter: Secondary | ICD-10-CM | POA: Diagnosis not present

## 2016-08-11 DIAGNOSIS — Z23 Encounter for immunization: Secondary | ICD-10-CM | POA: Diagnosis not present

## 2017-05-01 DIAGNOSIS — Z6828 Body mass index (BMI) 28.0-28.9, adult: Secondary | ICD-10-CM | POA: Diagnosis not present

## 2017-05-01 DIAGNOSIS — Z124 Encounter for screening for malignant neoplasm of cervix: Secondary | ICD-10-CM | POA: Diagnosis not present

## 2017-05-30 DIAGNOSIS — T7840XA Allergy, unspecified, initial encounter: Secondary | ICD-10-CM | POA: Diagnosis not present

## 2017-05-30 DIAGNOSIS — W57XXXA Bitten or stung by nonvenomous insect and other nonvenomous arthropods, initial encounter: Secondary | ICD-10-CM | POA: Diagnosis not present

## 2017-05-30 DIAGNOSIS — S60561A Insect bite (nonvenomous) of right hand, initial encounter: Secondary | ICD-10-CM | POA: Diagnosis not present

## 2017-06-07 ENCOUNTER — Other Ambulatory Visit: Payer: Self-pay | Admitting: Obstetrics & Gynecology

## 2017-06-07 DIAGNOSIS — Z1231 Encounter for screening mammogram for malignant neoplasm of breast: Secondary | ICD-10-CM

## 2017-06-16 ENCOUNTER — Ambulatory Visit
Admission: RE | Admit: 2017-06-16 | Discharge: 2017-06-16 | Disposition: A | Discharge status: Home / Self Care | Payer: Medicare Other | Source: Ambulatory Visit | Attending: Obstetrics & Gynecology | Admitting: Obstetrics & Gynecology

## 2017-06-16 DIAGNOSIS — Z1231 Encounter for screening mammogram for malignant neoplasm of breast: Secondary | ICD-10-CM | POA: Diagnosis not present

## 2017-06-21 DIAGNOSIS — Z0000 Encounter for general adult medical examination without abnormal findings: Secondary | ICD-10-CM | POA: Diagnosis not present

## 2017-06-21 DIAGNOSIS — E7800 Pure hypercholesterolemia, unspecified: Secondary | ICD-10-CM | POA: Diagnosis not present

## 2017-06-21 DIAGNOSIS — Z136 Encounter for screening for cardiovascular disorders: Secondary | ICD-10-CM | POA: Diagnosis not present

## 2017-06-21 DIAGNOSIS — E559 Vitamin D deficiency, unspecified: Secondary | ICD-10-CM | POA: Diagnosis not present

## 2017-06-21 DIAGNOSIS — M8580 Other specified disorders of bone density and structure, unspecified site: Secondary | ICD-10-CM | POA: Diagnosis not present

## 2017-06-21 DIAGNOSIS — Z1159 Encounter for screening for other viral diseases: Secondary | ICD-10-CM | POA: Diagnosis not present

## 2017-06-21 DIAGNOSIS — I10 Essential (primary) hypertension: Secondary | ICD-10-CM | POA: Diagnosis not present

## 2017-06-21 DIAGNOSIS — Z131 Encounter for screening for diabetes mellitus: Secondary | ICD-10-CM | POA: Diagnosis not present

## 2017-07-26 DIAGNOSIS — Z23 Encounter for immunization: Secondary | ICD-10-CM | POA: Diagnosis not present

## 2017-08-04 DIAGNOSIS — F419 Anxiety disorder, unspecified: Secondary | ICD-10-CM | POA: Diagnosis not present

## 2017-08-04 DIAGNOSIS — F410 Panic disorder [episodic paroxysmal anxiety] without agoraphobia: Secondary | ICD-10-CM | POA: Diagnosis not present

## 2017-08-04 DIAGNOSIS — R002 Palpitations: Secondary | ICD-10-CM | POA: Diagnosis not present

## 2017-08-04 DIAGNOSIS — I10 Essential (primary) hypertension: Secondary | ICD-10-CM | POA: Diagnosis not present

## 2017-08-04 DIAGNOSIS — Z8349 Family history of other endocrine, nutritional and metabolic diseases: Secondary | ICD-10-CM | POA: Diagnosis not present

## 2017-08-04 DIAGNOSIS — R0789 Other chest pain: Secondary | ICD-10-CM | POA: Diagnosis not present

## 2018-05-02 DIAGNOSIS — N760 Acute vaginitis: Secondary | ICD-10-CM | POA: Diagnosis not present

## 2018-05-02 DIAGNOSIS — Z124 Encounter for screening for malignant neoplasm of cervix: Secondary | ICD-10-CM | POA: Diagnosis not present

## 2018-05-02 DIAGNOSIS — Z6828 Body mass index (BMI) 28.0-28.9, adult: Secondary | ICD-10-CM | POA: Diagnosis not present

## 2018-05-07 DIAGNOSIS — R030 Elevated blood-pressure reading, without diagnosis of hypertension: Secondary | ICD-10-CM | POA: Diagnosis not present

## 2018-05-07 DIAGNOSIS — F418 Other specified anxiety disorders: Secondary | ICD-10-CM | POA: Diagnosis not present

## 2018-06-20 ENCOUNTER — Other Ambulatory Visit: Payer: Self-pay | Admitting: Obstetrics & Gynecology

## 2018-06-20 DIAGNOSIS — Z1231 Encounter for screening mammogram for malignant neoplasm of breast: Secondary | ICD-10-CM

## 2018-07-16 ENCOUNTER — Ambulatory Visit
Admission: RE | Admit: 2018-07-16 | Discharge: 2018-07-16 | Disposition: A | Discharge status: Home / Self Care | Payer: Medicare Other | Source: Ambulatory Visit | Attending: Obstetrics & Gynecology | Admitting: Obstetrics & Gynecology

## 2018-07-16 DIAGNOSIS — Z1231 Encounter for screening mammogram for malignant neoplasm of breast: Secondary | ICD-10-CM | POA: Diagnosis not present

## 2018-07-30 DIAGNOSIS — Z23 Encounter for immunization: Secondary | ICD-10-CM | POA: Diagnosis not present

## 2018-11-11 ENCOUNTER — Other Ambulatory Visit: Payer: Self-pay

## 2018-11-11 ENCOUNTER — Emergency Department (HOSPITAL_COMMUNITY): Payer: Medicare Other

## 2018-11-11 ENCOUNTER — Encounter (HOSPITAL_COMMUNITY): Payer: Self-pay

## 2018-11-11 ENCOUNTER — Inpatient Hospital Stay (HOSPITAL_COMMUNITY)
Admission: EM | Admit: 2018-11-11 | Discharge: 2018-11-13 | DRG: 419 | Disposition: A | Discharge status: Home / Self Care | Payer: Medicare Other | Attending: General Surgery | Admitting: General Surgery

## 2018-11-11 DIAGNOSIS — F419 Anxiety disorder, unspecified: Secondary | ICD-10-CM | POA: Diagnosis not present

## 2018-11-11 DIAGNOSIS — K819 Cholecystitis, unspecified: Secondary | ICD-10-CM | POA: Diagnosis present

## 2018-11-11 DIAGNOSIS — K8000 Calculus of gallbladder with acute cholecystitis without obstruction: Principal | ICD-10-CM | POA: Diagnosis present

## 2018-11-11 DIAGNOSIS — K810 Acute cholecystitis: Secondary | ICD-10-CM

## 2018-11-11 DIAGNOSIS — R1011 Right upper quadrant pain: Secondary | ICD-10-CM | POA: Diagnosis not present

## 2018-11-11 DIAGNOSIS — Z79899 Other long term (current) drug therapy: Secondary | ICD-10-CM

## 2018-11-11 DIAGNOSIS — K8010 Calculus of gallbladder with chronic cholecystitis without obstruction: Secondary | ICD-10-CM | POA: Diagnosis not present

## 2018-11-11 DIAGNOSIS — Z7982 Long term (current) use of aspirin: Secondary | ICD-10-CM | POA: Diagnosis not present

## 2018-11-11 DIAGNOSIS — K760 Fatty (change of) liver, not elsewhere classified: Secondary | ICD-10-CM | POA: Diagnosis present

## 2018-11-11 DIAGNOSIS — K8020 Calculus of gallbladder without cholecystitis without obstruction: Secondary | ICD-10-CM

## 2018-11-11 DIAGNOSIS — M1990 Unspecified osteoarthritis, unspecified site: Secondary | ICD-10-CM | POA: Diagnosis not present

## 2018-11-11 DIAGNOSIS — R079 Chest pain, unspecified: Secondary | ICD-10-CM | POA: Diagnosis not present

## 2018-11-11 HISTORY — DX: Anxiety disorder, unspecified: F41.9

## 2018-11-11 LAB — BASIC METABOLIC PANEL
ANION GAP: 10 (ref 5–15)
BUN: 15 mg/dL (ref 8–23)
CALCIUM: 8.7 mg/dL — AB (ref 8.9–10.3)
CO2: 21 mmol/L — ABNORMAL LOW (ref 22–32)
Chloride: 106 mmol/L (ref 98–111)
Creatinine, Ser: 0.68 mg/dL (ref 0.44–1.00)
Glucose, Bld: 141 mg/dL — ABNORMAL HIGH (ref 70–99)
Potassium: 3.6 mmol/L (ref 3.5–5.1)
Sodium: 137 mmol/L (ref 135–145)

## 2018-11-11 LAB — CBC
HCT: 43.7 % (ref 36.0–46.0)
Hemoglobin: 14.1 g/dL (ref 12.0–15.0)
MCH: 29.2 pg (ref 26.0–34.0)
MCHC: 32.3 g/dL (ref 30.0–36.0)
MCV: 90.5 fL (ref 80.0–100.0)
PLATELETS: 171 10*3/uL (ref 150–400)
RBC: 4.83 MIL/uL (ref 3.87–5.11)
RDW: 13.8 % (ref 11.5–15.5)
WBC: 15.1 10*3/uL — ABNORMAL HIGH (ref 4.0–10.5)
nRBC: 0 % (ref 0.0–0.2)

## 2018-11-11 LAB — HEPATIC FUNCTION PANEL
ALBUMIN: 4.3 g/dL (ref 3.5–5.0)
ALT: 35 U/L (ref 0–44)
AST: 35 U/L (ref 15–41)
Alkaline Phosphatase: 55 U/L (ref 38–126)
Bilirubin, Direct: <0.1 mg/dL (ref 0.0–0.2)
Total Bilirubin: 0.6 mg/dL (ref 0.3–1.2)
Total Protein: 8 g/dL (ref 6.5–8.1)

## 2018-11-11 LAB — URINALYSIS, ROUTINE W REFLEX MICROSCOPIC
Bilirubin Urine: NEGATIVE
Glucose, UA: NEGATIVE mg/dL
Hgb urine dipstick: NEGATIVE
Ketones, ur: 5 mg/dL — AB
Nitrite: NEGATIVE
Protein, ur: NEGATIVE mg/dL
Specific Gravity, Urine: 1.026 (ref 1.005–1.030)
pH: 6 (ref 5.0–8.0)

## 2018-11-11 LAB — I-STAT TROPONIN, ED: TROPONIN I, POC: 0 ng/mL (ref 0.00–0.08)

## 2018-11-11 MED ORDER — FENTANYL CITRATE (PF) 100 MCG/2ML IJ SOLN
25.0000 ug | Freq: Once | INTRAMUSCULAR | Status: AC
  Administered 2018-11-11: 25 ug via INTRAVENOUS
  Filled 2018-11-11: qty 2

## 2018-11-11 MED ORDER — ZOLPIDEM TARTRATE 5 MG PO TABS: 5.0000 mg | ORAL_TABLET | Freq: Every evening | ORAL | Status: DC | PRN

## 2018-11-11 MED ORDER — LORAZEPAM 0.5 MG PO TABS: 0.5000 mg | ORAL_TABLET | Freq: Two times a day (BID) | ORAL | Status: DC | PRN

## 2018-11-11 MED ORDER — DIPHENHYDRAMINE HCL 12.5 MG/5ML PO ELIX: 12.5000 mg | ORAL_SOLUTION | Freq: Four times a day (QID) | ORAL | Status: DC | PRN

## 2018-11-11 MED ORDER — ONDANSETRON HCL 4 MG/2ML IJ SOLN: 4.0000 mg | Freq: Four times a day (QID) | INTRAMUSCULAR | Status: DC | PRN

## 2018-11-11 MED ORDER — ACETAMINOPHEN 650 MG RE SUPP: 650.0000 mg | Freq: Four times a day (QID) | RECTAL | Status: DC | PRN

## 2018-11-11 MED ORDER — FENTANYL CITRATE (PF) 100 MCG/2ML IJ SOLN: 25.0000 ug | INTRAMUSCULAR | Status: DC | PRN

## 2018-11-11 MED ORDER — SODIUM CHLORIDE 0.9% FLUSH: 3.0000 mL | Freq: Once | INTRAVENOUS | Status: DC

## 2018-11-11 MED ORDER — SODIUM CHLORIDE 0.9 % IV SOLN
INTRAVENOUS | Status: DC
  Administered 2018-11-11 – 2018-11-13 (×4): via INTRAVENOUS

## 2018-11-11 MED ORDER — ENOXAPARIN SODIUM 40 MG/0.4ML ~~LOC~~ SOLN
40.0000 mg | SUBCUTANEOUS | Status: DC
  Administered 2018-11-12: 40 mg via SUBCUTANEOUS
  Filled 2018-11-11: qty 0.4

## 2018-11-11 MED ORDER — DOCUSATE SODIUM 100 MG PO CAPS
100.0000 mg | ORAL_CAPSULE | Freq: Two times a day (BID) | ORAL | Status: DC
  Administered 2018-11-11 – 2018-11-13 (×3): 100 mg via ORAL
  Filled 2018-11-11 (×3): qty 1

## 2018-11-11 MED ORDER — METHOCARBAMOL 1000 MG/10ML IJ SOLN
500.0000 mg | Freq: Four times a day (QID) | INTRAVENOUS | Status: DC | PRN
  Filled 2018-11-11: qty 5

## 2018-11-11 MED ORDER — METHOCARBAMOL 500 MG PO TABS: 500.0000 mg | ORAL_TABLET | Freq: Four times a day (QID) | ORAL | Status: DC | PRN

## 2018-11-11 MED ORDER — METOPROLOL TARTRATE 5 MG/5ML IV SOLN: 5.0000 mg | Freq: Four times a day (QID) | INTRAVENOUS | Status: DC | PRN

## 2018-11-11 MED ORDER — IBUPROFEN 200 MG PO TABS: 600.0000 mg | ORAL_TABLET | Freq: Four times a day (QID) | ORAL | Status: DC | PRN

## 2018-11-11 MED ORDER — HYDROMORPHONE HCL 1 MG/ML IJ SOLN
0.5000 mg | INTRAMUSCULAR | Status: DC | PRN
  Administered 2018-11-11 – 2018-11-12 (×3): 0.5 mg via INTRAVENOUS
  Filled 2018-11-11: qty 0.5
  Filled 2018-11-11: qty 1
  Filled 2018-11-11: qty 0.5

## 2018-11-11 MED ORDER — ACETAMINOPHEN 325 MG PO TABS
650.0000 mg | ORAL_TABLET | Freq: Four times a day (QID) | ORAL | Status: DC | PRN
  Administered 2018-11-12 (×2): 650 mg via ORAL
  Filled 2018-11-11 (×2): qty 2

## 2018-11-11 MED ORDER — PIPERACILLIN-TAZOBACTAM 3.375 G IVPB
3.3750 g | Freq: Three times a day (TID) | INTRAVENOUS | Status: DC
  Administered 2018-11-12 – 2018-11-13 (×5): 3.375 g via INTRAVENOUS
  Filled 2018-11-11 (×5): qty 50

## 2018-11-11 MED ORDER — ONDANSETRON 4 MG PO TBDP: 4.0000 mg | ORAL_TABLET | Freq: Four times a day (QID) | ORAL | Status: DC | PRN

## 2018-11-11 MED ORDER — TRAMADOL HCL 50 MG PO TABS: 50.0000 mg | ORAL_TABLET | Freq: Four times a day (QID) | ORAL | Status: DC | PRN

## 2018-11-11 MED ORDER — HYDRALAZINE HCL 20 MG/ML IJ SOLN: 10.0000 mg | INTRAMUSCULAR | Status: DC | PRN

## 2018-11-11 MED ORDER — DIPHENHYDRAMINE HCL 50 MG/ML IJ SOLN: 12.5000 mg | Freq: Four times a day (QID) | INTRAMUSCULAR | Status: DC | PRN

## 2018-11-11 NOTE — ED Notes (Signed)
Report given to Jonelle Sidle for [place], Room 1524.

## 2018-11-11 NOTE — ED Provider Notes (Signed)
  Face-to-face evaluation   History: She complains of mid chest pain last night after eating, which resolved spontaneously.  This morning she had onset of right upper quadrant pain, with vomiting.  No prior similar problems.  There are no other known modifying factors.  Physical exam: Alert elderly female who is calm and comfortable.  Abdomen is soft with mild right upper quadrant tenderness, examined after receiving narcotic analgesia.  Patient is nontoxic in appearance.  Medical screening examination/treatment/procedure(s) were conducted as a shared visit with non-physician practitioner(s) and myself.  I personally evaluated the patient during the encounter    Daleen Bo, MD 11/13/18 1348

## 2018-11-11 NOTE — H&P (Signed)
Surgical H&P  CC: abdominal pain  HPI: Very pleasant and healthy 73yo woman who developed lower chest pain yesterday evening following a salad for dinner. The pain radiated to her back and then began to involve the right side of the abdomen. It was constant and she felt like it may be relieved if she could have a bowel movement. She was unable to sleep all night due to pain. This morning the chest pain was improved but the abdominal pain persisted. She tried to have some coffee and toast and then experienced nausea and emesis. No fevers. No prior similar symptoms.  No prior abdominal surgery.   No Known Allergies  Past Medical History:  Diagnosis Date  . Anxiety   . Arthritis    neck    Past Surgical History:  Procedure Laterality Date  . AUGMENTATION MAMMAPLASTY Bilateral   . bilateral breast reduction  2001  . FACIAL COSMETIC SURGERY  2001  . PLACEMENT OF BREAST IMPLANTS      Family History  Problem Relation Age of Onset  . Colon cancer Neg Hx   . Stomach cancer Neg Hx   . Breast cancer Neg Hx     Social History   Socioeconomic History  . Marital status: Married    Spouse name: Not on file  . Number of children: Not on file  . Years of education: Not on file  . Highest education level: Not on file  Occupational History  . Not on file  Social Needs  . Financial resource strain: Not on file  . Food insecurity:    Worry: Not on file    Inability: Not on file  . Transportation needs:    Medical: Not on file    Non-medical: Not on file  Tobacco Use  . Smoking status: Never Smoker  . Smokeless tobacco: Never Used  Substance and Sexual Activity  . Alcohol use: Yes    Alcohol/week: 1.0 standard drinks    Types: 1 Glasses of wine per week  . Drug use: No  . Sexual activity: Not on file  Lifestyle  . Physical activity:    Days per week: Not on file    Minutes per session: Not on file  . Stress: Not on file  Relationships  . Social connections:    Talks on  phone: Not on file    Gets together: Not on file    Attends religious service: Not on file    Active member of club or organization: Not on file    Attends meetings of clubs or organizations: Not on file    Relationship status: Not on file  Other Topics Concern  . Not on file  Social History Narrative  . Not on file    No current facility-administered medications on file prior to encounter.    Current Outpatient Medications on File Prior to Encounter  Medication Sig Dispense Refill  . aspirin 81 MG chewable tablet Chew 81 mg by mouth daily.    . Cholecalciferol (VITAMIN D) 2000 UNITS CAPS Take 1 capsule by mouth daily.     Marland Kitchen estrogens, conjugated, (PREMARIN) 0.3 MG tablet Take 0.3 mg by mouth daily. Take daily for 21 days then do not take for 7 days.    Marland Kitchen LORazepam (ATIVAN) 1 MG tablet Take 0.5 mg by mouth 2 (two) times daily.    Marland Kitchen LYSINE PO Take 1 capsule by mouth daily.    . Omega-3 Fatty Acids (OMEGA 3 PO) Take 1 capsule by mouth  daily.    . progesterone (PROMETRIUM) 100 MG capsule Take 100 mg by mouth at bedtime.    Marland Kitchen zolpidem (AMBIEN) 10 MG tablet Take 10 mg by mouth at bedtime as needed for sleep.      Review of Systems: a complete, 10pt review of systems was completed with pertinent positives and negatives as documented in the HPI  Physical Exam: Vitals:   11/11/18 1500 11/11/18 1530  BP: (!) 157/68 (!) 144/58  Pulse: 92 94  Resp: 18 17  Temp:    SpO2: 96% 95%   Gen: A&Ox3, no distress  Head: normocephalic, atraumatic Eyes: extraocular motions intact, anicteric.  Neck: supple without mass or thyromegaly Chest: unlabored respirations, symmetrical air entry, clear bilaterally   Cardiovascular: RRR with palpable distal pulses, no pedal edema Abdomen: soft, nondistended, tender with voluntary guarding in the right upper quadrant and epigastrium. No mass or organomegaly.  Extremities: warm, without edema, no deformities  Neuro: grossly intact Psych: appropriate mood  and affect, normal insight  Skin: warm and dry   CBC Latest Ref Rng & Units 11/11/2018 03/17/2013 03/17/2013  WBC 4.0 - 10.5 K/uL 15.1(H) - 8.8  Hemoglobin 12.0 - 15.0 g/dL 14.1 13.3 12.5  Hematocrit 36.0 - 46.0 % 43.7 39.0 36.7  Platelets 150 - 400 K/uL 171 - 185    CMP Latest Ref Rng & Units 11/11/2018 03/17/2013  Glucose 70 - 99 mg/dL 141(H) 109(H)  BUN 8 - 23 mg/dL 15 24(H)  Creatinine 0.44 - 1.00 mg/dL 0.68 1.60(H)  Sodium 135 - 145 mmol/L 137 141  Potassium 3.5 - 5.1 mmol/L 3.6 3.8  Chloride 98 - 111 mmol/L 106 108  CO2 22 - 32 mmol/L 21(L) -  Calcium 8.9 - 10.3 mg/dL 8.7(L) -  Total Protein 6.5 - 8.1 g/dL 8.0 -  Total Bilirubin 0.3 - 1.2 mg/dL 0.6 -  Alkaline Phos 38 - 126 U/L 55 -  AST 15 - 41 U/L 35 -  ALT 0 - 44 U/L 35 -    No results found for: INR, PROTIME  Imaging: Dg Chest 2 View  Result Date: 11/11/2018 CLINICAL DATA:  Chest pain radiating to the back last night. EXAM: CHEST - 2 VIEW COMPARISON:  None. FINDINGS: The heart size and mediastinal contours are within normal limits. Both lungs are clear. The visualized skeletal structures are unremarkable. Bilateral implants are noted. IMPRESSION: No active cardiopulmonary disease. Electronically Signed   By: Abelardo Diesel M.D.   On: 11/11/2018 13:17   US Abdomen Limited Ruq  Result Date: 11/11/2018 CLINICAL DATA:  73 year old with right upper quadrant pain. Vomiting. EXAM: ULTRASOUND ABDOMEN LIMITED RIGHT UPPER QUADRANT COMPARISON:  CT from 03/17/2013 FINDINGS: Gallbladder: Non mobile stone at the base of the gallbladder with posterior acoustic shadowing. Stone measures up to 1.8 cm. The gallbladder is mildly distended. The gallbladder wall is irregular and thickened, measuring up to 4 mm. In addition, there is pericholecystic fluid. Positive for a sonographic Murphy sign. Common bile duct: Diameter: 6 mm Liver: Increased echogenicity in the liver. Poor definition of the internal architecture and compatible with hepatic  steatosis. Indeterminate hypoechoic structure in the left hepatic lobe measures 1.0 x 0.8 x 0.6 cm. This structure is not compatible with a simple cyst. Portal vein is patent on color Doppler imaging with normal direction of blood flow towards the liver. IMPRESSION: 1. Findings compatible with the acute cholecystitis with cholelithiasis. Gallbladder is distended with irregular wall thickening, non mobile gallstone, Murphy sign and pericholecystic fluid. 2. Hepatic steatosis.  Increased echogenicity in the liver with poor internal architecture. 3. Indeterminate 1.0 cm hypoechoic structure in the left hepatic lobe. This is not compatible with a simple cyst. Based on the extensive hepatic steatosis, this could represent a small focus of fat sparing. Due to the small size of this area, consider surveillance with ultrasound. Electronically Signed   By: Markus Daft M.D.   On: 11/11/2018 15:01     A/P: 73yo woman with acute cholecystitis clinically and confirmed by [place]. I recommend proceeding with laparoscopic cholecystectomy with possible cholangiogram. Discussed risks of surgery including bleeding, pain, scarring, intraabdominal injury specifically to the common bile duct and sequelae, conversion to open surgery, blood clot, pneumonia, heart attack, stroke, failure to resolve symptoms, etc/ Questions welcomed and answered. Will admit for fluid resuscitation, empiric antibiotics. NPO midnight. Plan surgery tomorrow morning with Dr. Marlou Starks.    Romana Juniper, MD [facility], [place] Pager [phone]

## 2018-11-11 NOTE — ED Provider Notes (Signed)
[facility] DEPT Provider Note   CSN: [id number] Arrival date & time: 11/11/18  1154     History   Chief Complaint Chief Complaint  Patient presents with  . Chest Pain  . Emesis    HPI Karina Morris is a 73 y.o. female.  The history is provided by the patient and medical records. No language interpreter was used.  Chest Pain  Associated symptoms: nausea and vomiting   Associated symptoms: no abdominal pain, no palpitations and no shortness of breath   Emesis  Associated symptoms: no abdominal pain and no diarrhea    Karina Morris is a 73 y.o. female  with a PMH of anxiety, arthritis who presents to the Emergency Department for evaluation of feeling ill since last night.  Patient states around 5 PM, she developed sharp, central chest pain which she felt radiated to her back.  This was constant for a few hours then resolved.  No associated shortness of breath, diaphoresis.  She has not had any further chest pain since last night.  When she awoke this morning, she was feeling okay, but shortly after awakening, she developed nausea and had an episode of vomiting.  She has not tried to eat or drink anything since.  Denies any fever.  No medication taken at home prior to arrival for symptoms.  Had a normal bowel movement this morning with no blood or loose stool.  Past Medical History:  Diagnosis Date  . Anxiety   . Arthritis    neck    There are no active problems to display for this patient.   Past Surgical History:  Procedure Laterality Date  . AUGMENTATION MAMMAPLASTY Bilateral   . bilateral breast reduction  2001  . FACIAL COSMETIC SURGERY  2001  . PLACEMENT OF BREAST IMPLANTS       OB History   No obstetric history on file.      Home Medications    Prior to Admission medications   Medication Sig Start Date End Date Taking? Authorizing Provider  Cholecalciferol (VITAMIN D) 2000 UNITS CAPS Take 1 capsule by mouth daily.      [provider]  estrogens, conjugated, (PREMARIN) 0.45 MG tablet Take 0.45 mg by mouth daily. Continuous over 30 days    [provider]  progesterone (PROMETRIUM) 200 MG capsule Take 200 mg by mouth daily. Take 1 tablet daily for 12 days once every 3 months, on days 1st -12 th of the month    [provider]  zolpidem (AMBIEN) 10 MG tablet Take 10 mg by mouth at bedtime as needed for sleep.    [provider]    Family History Family History  Problem Relation Age of Onset  . Colon cancer Neg Hx   . Stomach cancer Neg Hx   . Breast cancer Neg Hx     Social History Social History   Tobacco Use  . Smoking status: Never Smoker  . Smokeless tobacco: Never Used  Substance Use Topics  . Alcohol use: Yes    Alcohol/week: 1.0 standard drinks    Types: 1 Glasses of wine per week  . Drug use: No     Allergies   Patient has no known allergies.   Review of Systems Review of Systems  Respiratory: Negative for shortness of breath.   Cardiovascular: Positive for chest pain. Negative for palpitations and leg swelling.  Gastrointestinal: Positive for nausea and vomiting. Negative for abdominal pain, blood in stool and diarrhea.  All other systems reviewed and are negative.    Physical Exam Updated Vital Signs BP (!) 159/76 (BP Location: Left Arm)   Pulse 88   Temp 98.4 F (36.9 C) (Oral)   Resp (!) 22   Ht 5' 3.5" (1.613 m)   Wt 68.9 kg   SpO2 97%   BMI 26.50 kg/m   Physical Exam Vitals signs and nursing note reviewed.  Constitutional:      General: She is not in acute distress.    Appearance: She is well-developed.  HENT:     Head: Normocephalic and atraumatic.  Neck:     Musculoskeletal: Neck supple.  Cardiovascular:     Rate and Rhythm: Normal rate and regular rhythm.     Heart sounds: Normal heart sounds. No murmur.  Pulmonary:     Effort: Pulmonary effort is normal. No respiratory distress.     Breath sounds: Normal breath  sounds.  Abdominal:     General: There is no distension.     Palpations: Abdomen is soft.     Comments: Tenderness to palpation to the right upper quadrant with guarding.  Skin:    General: Skin is warm and dry.  Neurological:     Mental Status: She is alert and oriented to person, place, and time.      ED Treatments / Results  Labs (all labs ordered are listed, but only abnormal results are displayed) Labs Reviewed  BASIC METABOLIC PANEL - Abnormal; Notable for the following components:      Result Value   CO2 21 (*)    Glucose, Bld 141 (*)    Calcium 8.7 (*)    All other components within normal limits  CBC - Abnormal; Notable for the following components:   WBC 15.1 (*)    All other components within normal limits  HEPATIC FUNCTION PANEL  URINALYSIS, ROUTINE W REFLEX MICROSCOPIC  I-STAT TROPONIN, ED    EKG EKG Interpretation  Date/Time:  Sunday November 11 2018 12:04:47 EST Ventricular Rate:  90 PR Interval:    QRS Duration: 85 QT Interval:  340 QTC Calculation: 416 R Axis:   58 Text Interpretation:  Sinus rhythm Probable left atrial enlargement Baseline wander in lead(s) V3 No old tracing to compare Confirmed by Daleen Bo [phone]) on 11/11/2018 1:00:15 PM   Radiology Dg Chest 2 View  Result Date: 11/11/2018 CLINICAL DATA:  Chest pain radiating to the back last night. EXAM: CHEST - 2 VIEW COMPARISON:  None. FINDINGS: The heart size and mediastinal contours are within normal limits. Both lungs are clear. The visualized skeletal structures are unremarkable. Bilateral implants are noted. IMPRESSION: No active cardiopulmonary disease. Electronically Signed   By: Abelardo Diesel M.D.   On: 11/11/2018 13:17    Procedures Procedures (including critical care time)  Medications Ordered in ED Medications  sodium chloride flush (NS) 0.9 % injection 3 mL (0 mLs Intravenous Hold 11/11/18 1216)     Initial Impression / Assessment and Plan / ED Course  I have reviewed  the triage vital signs and the nursing notes.  Pertinent labs & imaging results that were available during my care of the patient were reviewed by me and considered in my medical decision making (see chart for details).    Karina Morris is a 73 y.o. female who presents to ED for sharp chest pain which occurred yesterday at around 5 PM.  Lasted a few hours and then resolved.  Sounds atypical.  EKG without acute ischemic changes.  Troponin negative.  Doubt ACS.  When she awoke this morning, she developed nausea and vomiting as well as epigastric and right upper quadrant pain.  She is very tender to the right upper quadrant with guarding.  Labs reviewed and notable for leukocytosis of 15.1.  Right upper quadrant ultrasound with findings concerning for acute cholecystitis with cholelithiasis.  Consulted general surgery, Dr. Windle Guard, who will admit.   Final Clinical Impressions(s) / ED Diagnoses   Final diagnoses:  RUQ pain    ED Discharge Orders    None       Ward, Ozella Almond, PA-C 11/11/18 1556    Daleen Bo, MD 11/13/18 1348

## 2018-11-11 NOTE — ED Notes (Signed)
ED TO INPATIENT HANDOFF REPORT  Name/Age/Gender Karina Morris 73 y.o. female  Code Status    Code Status Orders  (From admission, onward)         Start     Ordered   11/11/18 1559  Full code  Continuous     11/11/18 1601        Code Status History    This patient has a current code status but no historical code status.    Advance Directive Documentation     Most Recent Value  Type of Advance Directive  Living will  Pre-existing out of facility DNR order (yellow form or pink MOST form)  -  "MOST" Form in Place?  -      Home/SNF/Other Home  Chief Complaint emesis; chest pain  Level of Care/Admitting Diagnosis ED Disposition    ED Disposition Condition [place]: [facility] [100102]  Level of Care: Med-Surg [16]  Diagnosis: Cholecystitis [211941]  Admitting Physician: [facility], [place]  Attending Physician: [facility], [place]  Bed request comments: 5W  PT Class (Do Not Modify): Observation [104]  PT Acc Code (Do Not Modify): Observation [10022]       Medical History Past Medical History:  Diagnosis Date  . Anxiety   . Arthritis    neck    Allergies No Known Allergies  IV Location/Drains/Wounds Patient Lines/Drains/Airways Status   Active Line/Drains/Airways    Name:   Placement date:   Placement time:   Site:   Days:   Peripheral IV 11/11/18 Left Antecubital   11/11/18    1231    Antecubital   less than 1          Labs/Imaging Results for orders placed or performed during the hospital encounter of 11/11/18 (from the past 48 hour(s))  Basic metabolic panel     Status: Abnormal   Collection Time: 11/11/18 12:33 PM  Result Value Ref Range   Sodium 137 135 - 145 mmol/L   Potassium 3.6 3.5 - 5.1 mmol/L   Chloride 106 98 - 111 mmol/L   CO2 21 (L) 22 - 32 mmol/L   Glucose, Bld 141 (H) 70 - 99 mg/dL   BUN 15 8 - 23 mg/dL   Creatinine, Ser 0.68 0.44 - 1.00 mg/dL   Calcium 8.7 (L) 8.9 - 10.3 mg/dL   GFR calc  non Af Amer >60 >60 mL/min   GFR calc Af Amer >60 >60 mL/min   Anion gap 10 5 - 15    Comment: Performed at [facility], [place] [address], [place], [place] [address]  CBC     Status: Abnormal   Collection Time: 11/11/18 12:33 PM  Result Value Ref Range   WBC 15.1 (H) 4.0 - 10.5 K/uL   RBC 4.83 3.87 - 5.11 MIL/uL   Hemoglobin 14.1 12.0 - 15.0 g/dL   HCT 43.7 36.0 - 46.0 %   MCV 90.5 80.0 - 100.0 fL   MCH 29.2 26.0 - 34.0 pg   MCHC 32.3 30.0 - 36.0 g/dL   RDW 13.8 11.5 - 15.5 %   Platelets 171 150 - 400 K/uL   nRBC 0.0 0.0 - 0.2 %    Comment: Performed at [facility], [place] [address], [place], [place] [address]  Hepatic function panel     Status: None   Collection Time: 11/11/18 12:33 PM  Result Value Ref Range   Total Protein 8.0 6.5 - 8.1 g/dL  Albumin 4.3 3.5 - 5.0 g/dL   AST 35 15 - 41 U/L   ALT 35 0 - 44 U/L   Alkaline Phosphatase 55 38 - 126 U/L   Total Bilirubin 0.6 0.3 - 1.2 mg/dL   Bilirubin, Direct <0.1 0.0 - 0.2 mg/dL   Indirect Bilirubin NOT CALCULATED 0.3 - 0.9 mg/dL    Comment: Performed at [facility], [place] [address], [place], [place] [address]  I-stat troponin, ED     Status: None   Collection Time: 11/11/18 12:37 PM  Result Value Ref Range   Troponin i, poc 0.00 0.00 - 0.08 ng/mL   Comment 3            Comment: Due to the release kinetics of cTnI, a negative result within the first hours of the onset of symptoms does not rule out myocardial infarction with certainty. If myocardial infarction is still suspected, repeat the test at appropriate intervals.    Dg Chest 2 View  Result Date: 11/11/2018 CLINICAL DATA:  Chest pain radiating to the back last night. EXAM: CHEST - 2 VIEW COMPARISON:  None. FINDINGS: The heart size and mediastinal contours are within normal limits. Both lungs are clear. The visualized skeletal structures are unremarkable. Bilateral implants are noted. IMPRESSION: No active  cardiopulmonary disease. Electronically Signed   By: Abelardo Diesel M.D.   On: 11/11/2018 13:17   US Abdomen Limited Ruq  Result Date: 11/11/2018 CLINICAL DATA:  73 year old with right upper quadrant pain. Vomiting. EXAM: ULTRASOUND ABDOMEN LIMITED RIGHT UPPER QUADRANT COMPARISON:  CT from 03/17/2013 FINDINGS: Gallbladder: Non mobile stone at the base of the gallbladder with posterior acoustic shadowing. Stone measures up to 1.8 cm. The gallbladder is mildly distended. The gallbladder wall is irregular and thickened, measuring up to 4 mm. In addition, there is pericholecystic fluid. Positive for a sonographic Murphy sign. Common bile duct: Diameter: 6 mm Liver: Increased echogenicity in the liver. Poor definition of the internal architecture and compatible with hepatic steatosis. Indeterminate hypoechoic structure in the left hepatic lobe measures 1.0 x 0.8 x 0.6 cm. This structure is not compatible with a simple cyst. Portal vein is patent on color Doppler imaging with normal direction of blood flow towards the liver. IMPRESSION: 1. Findings compatible with the acute cholecystitis with cholelithiasis. Gallbladder is distended with irregular wall thickening, non mobile gallstone, Murphy sign and pericholecystic fluid. 2. Hepatic steatosis. Increased echogenicity in the liver with poor internal architecture. 3. Indeterminate 1.0 cm hypoechoic structure in the left hepatic lobe. This is not compatible with a simple cyst. Based on the extensive hepatic steatosis, this could represent a small focus of fat sparing. Due to the small size of this area, consider surveillance with ultrasound. Electronically Signed   By: Markus Daft M.D.   On: 11/11/2018 15:01   EKG Interpretation  Date/Time:  Sunday November 11 2018 12:04:47 EST Ventricular Rate:  90 PR Interval:    QRS Duration: 85 QT Interval:  340 QTC Calculation: 416 R Axis:   58 Text Interpretation:  Sinus rhythm Probable left atrial enlargement Baseline  wander in lead(s) V3 No old tracing to compare Confirmed by Wentz, Elliott ([id number]) on 11/11/2018 1:00:15 PM   Pending Labs Unresulted Labs (From admission, onward)    Start     Ordered   11/12/18 0500  Comprehensive metabolic panel  Tomorrow morning,   R     01 /19/20 1601   11/12/18 0500  CBC  Tomorrow morning,   R  11/11/18 1601   11/11/18 1300  Urinalysis, Routine w reflex microscopic  ONCE - STAT,   STAT     11/11/18 1259          Vitals/Pain Today's Vitals   11/11/18 1530 11/11/18 1600 11/11/18 1630 11/11/18 1700  BP: (!) 144/58 (!) 147/88 (!) 155/73 (!) 150/75  Pulse: 94 (!) 101 91 85  Resp: 17     Temp:      TempSrc:      SpO2: 95% 96% 98% 99%  Weight:      Height:      PainSc:        Isolation Precautions No active isolations  Medications Medications  sodium chloride flush (NS) 0.9 % injection 3 mL (0 mLs Intravenous Hold 11/11/18 1216)  fentaNYL (SUBLIMAZE) injection 25 mcg (has no administration in time range)  enoxaparin (LOVENOX) injection 40 mg (has no administration in time range)  0.9 %  sodium chloride infusion (has no administration in time range)  piperacillin-tazobactam (ZOSYN) IVPB 3.375 g (has no administration in time range)  acetaminophen (TYLENOL) tablet 650 mg (has no administration in time range)    Or  acetaminophen (TYLENOL) suppository 650 mg (has no administration in time range)  ibuprofen (ADVIL,MOTRIN) tablet 600 mg (has no administration in time range)  traMADol (ULTRAM) tablet 50 mg (has no administration in time range)  HYDROmorphone (DILAUDID) injection 0.5 mg (has no administration in time range)  diphenhydrAMINE (BENADRYL) 12.5 MG/5ML elixir 12.5 mg (has no administration in time range)    Or  diphenhydrAMINE (BENADRYL) injection 12.5 mg (has no administration in time range)  docusate sodium (COLACE) capsule 100 mg (has no administration in time range)  ondansetron (ZOFRAN-ODT) disintegrating tablet 4 mg (has no administration  in time range)    Or  ondansetron (ZOFRAN) injection 4 mg (has no administration in time range)  metoprolol tartrate (LOPRESSOR) injection 5 mg (has no administration in time range)  hydrALAZINE (APRESOLINE) injection 10 mg (has no administration in time range)  methocarbamol (ROBAXIN) 500 mg in dextrose 5 % 50 mL IVPB (has no administration in time range)  LORazepam (ATIVAN) tablet 0.5 mg (has no administration in time range)  zolpidem (AMBIEN) tablet 5 mg (has no administration in time range)  fentaNYL (SUBLIMAZE) injection 25 mcg (25 mcg Intravenous Given 11/11/18 1434)    Mobility walks

## 2018-11-11 NOTE — ED Triage Notes (Signed)
Pt started with chest pain radiating to back last night.  No cough/congestion.  Pt states thought it might be anxiety.  Pain continued into this morning.  She decided to take anxiety med and vomited x 1.  No pain down either arm.  No history of reflux.  No recent travel.

## 2018-11-12 ENCOUNTER — Encounter (HOSPITAL_COMMUNITY): Payer: Self-pay | Admitting: Anesthesiology

## 2018-11-12 ENCOUNTER — Encounter (HOSPITAL_COMMUNITY)
Admission: EM | Disposition: A | Discharge status: Home / Self Care | Payer: Self-pay | Source: Home / Self Care | Attending: General Surgery

## 2018-11-12 ENCOUNTER — Observation Stay (HOSPITAL_COMMUNITY): Payer: Medicare Other | Admitting: Anesthesiology

## 2018-11-12 ENCOUNTER — Other Ambulatory Visit: Payer: Self-pay

## 2018-11-12 ENCOUNTER — Observation Stay (HOSPITAL_COMMUNITY): Payer: Medicare Other

## 2018-11-12 DIAGNOSIS — K760 Fatty (change of) liver, not elsewhere classified: Secondary | ICD-10-CM | POA: Diagnosis not present

## 2018-11-12 DIAGNOSIS — K915 Postcholecystectomy syndrome: Secondary | ICD-10-CM | POA: Diagnosis not present

## 2018-11-12 DIAGNOSIS — Z79899 Other long term (current) drug therapy: Secondary | ICD-10-CM | POA: Diagnosis not present

## 2018-11-12 DIAGNOSIS — M1990 Unspecified osteoarthritis, unspecified site: Secondary | ICD-10-CM | POA: Diagnosis not present

## 2018-11-12 DIAGNOSIS — K8000 Calculus of gallbladder with acute cholecystitis without obstruction: Secondary | ICD-10-CM | POA: Diagnosis not present

## 2018-11-12 DIAGNOSIS — K810 Acute cholecystitis: Secondary | ICD-10-CM | POA: Diagnosis not present

## 2018-11-12 DIAGNOSIS — K819 Cholecystitis, unspecified: Secondary | ICD-10-CM | POA: Diagnosis not present

## 2018-11-12 DIAGNOSIS — Z7982 Long term (current) use of aspirin: Secondary | ICD-10-CM | POA: Diagnosis not present

## 2018-11-12 DIAGNOSIS — F419 Anxiety disorder, unspecified: Secondary | ICD-10-CM | POA: Diagnosis not present

## 2018-11-12 HISTORY — PX: CHOLECYSTECTOMY: SHX55

## 2018-11-12 LAB — COMPREHENSIVE METABOLIC PANEL
ALBUMIN: 3.3 g/dL — AB (ref 3.5–5.0)
ALT: 219 U/L — ABNORMAL HIGH (ref 0–44)
AST: 224 U/L — ABNORMAL HIGH (ref 15–41)
Alkaline Phosphatase: 61 U/L (ref 38–126)
Anion gap: 11 (ref 5–15)
BUN: 9 mg/dL (ref 8–23)
CO2: 19 mmol/L — ABNORMAL LOW (ref 22–32)
CREATININE: 0.66 mg/dL (ref 0.44–1.00)
Calcium: 8.4 mg/dL — ABNORMAL LOW (ref 8.9–10.3)
Chloride: 107 mmol/L (ref 98–111)
GFR calc Af Amer: >60 mL/min (ref >60)
GFR calc non Af Amer: >60 mL/min (ref >60)
Glucose, Bld: 107 mg/dL — ABNORMAL HIGH (ref 70–99)
Potassium: 4.2 mmol/L (ref 3.5–5.1)
SODIUM: 137 mmol/L (ref 135–145)
Total Bilirubin: 1 mg/dL (ref 0.3–1.2)
Total Protein: 6.3 g/dL — ABNORMAL LOW (ref 6.5–8.1)

## 2018-11-12 LAB — CBC
HCT: 38.9 % (ref 36.0–46.0)
Hemoglobin: 12.4 g/dL (ref 12.0–15.0)
MCH: 29.6 pg (ref 26.0–34.0)
MCHC: 31.9 g/dL (ref 30.0–36.0)
MCV: 92.8 fL (ref 80.0–100.0)
NRBC: 0 % (ref 0.0–0.2)
Platelets: 135 10*3/uL — ABNORMAL LOW (ref 150–400)
RBC: 4.19 MIL/uL (ref 3.87–5.11)
RDW: 14.3 % (ref 11.5–15.5)
WBC: 13.2 10*3/uL — ABNORMAL HIGH (ref 4.0–10.5)

## 2018-11-12 LAB — MRSA PCR SCREENING: MRSA by PCR: NEGATIVE

## 2018-11-12 SURGERY — LAPAROSCOPIC CHOLECYSTECTOMY WITH INTRAOPERATIVE CHOLANGIOGRAM
Anesthesia: General | Site: Abdomen

## 2018-11-12 MED ORDER — LACTATED RINGERS IV SOLN
INTRAVENOUS | Status: AC | PRN
  Administered 2018-11-12: 1000 mL

## 2018-11-12 MED ORDER — OXYCODONE HCL 5 MG/5ML PO SOLN
5.0000 mg | Freq: Once | ORAL | Status: DC | PRN
  Filled 2018-11-12: qty 5

## 2018-11-12 MED ORDER — ACETAMINOPHEN 10 MG/ML IV SOLN: 1000.0000 mg | Freq: Once | INTRAVENOUS | Status: DC | PRN

## 2018-11-12 MED ORDER — DEXAMETHASONE SODIUM PHOSPHATE 10 MG/ML IJ SOLN
INTRAMUSCULAR | Status: AC
  Filled 2018-11-12: qty 1

## 2018-11-12 MED ORDER — LIDOCAINE 2% (20 MG/ML) 5 ML SYRINGE
INTRAMUSCULAR | Status: DC | PRN
  Administered 2018-11-12: 100 mg via INTRAVENOUS

## 2018-11-12 MED ORDER — HYDROMORPHONE HCL 2 MG/ML IJ SOLN
INTRAMUSCULAR | Status: AC
  Filled 2018-11-12: qty 1

## 2018-11-12 MED ORDER — OXYCODONE HCL 5 MG PO TABS: 5.0000 mg | ORAL_TABLET | Freq: Once | ORAL | Status: DC | PRN

## 2018-11-12 MED ORDER — ONDANSETRON HCL 4 MG/2ML IJ SOLN
INTRAMUSCULAR | Status: DC | PRN
  Administered 2018-11-12: 4 mg via INTRAVENOUS

## 2018-11-12 MED ORDER — ONDANSETRON HCL 4 MG/2ML IJ SOLN
INTRAMUSCULAR | Status: AC
  Filled 2018-11-12: qty 2

## 2018-11-12 MED ORDER — FENTANYL CITRATE (PF) 250 MCG/5ML IJ SOLN
INTRAMUSCULAR | Status: DC | PRN
  Administered 2018-11-12: 100 ug via INTRAVENOUS
  Administered 2018-11-12 (×3): 50 ug via INTRAVENOUS

## 2018-11-12 MED ORDER — SODIUM CHLORIDE 0.9 % IR SOLN
Status: DC | PRN
  Administered 2018-11-12: 1000 mL

## 2018-11-12 MED ORDER — SODIUM CHLORIDE (PF) 0.9 % IJ SOLN
INTRAMUSCULAR | Status: AC
  Filled 2018-11-12: qty 10

## 2018-11-12 MED ORDER — LACTATED RINGERS IV SOLN
INTRAVENOUS | Status: DC
  Administered 2018-11-12 (×2): via INTRAVENOUS

## 2018-11-12 MED ORDER — PROPOFOL 10 MG/ML IV BOLUS
INTRAVENOUS | Status: DC | PRN
  Administered 2018-11-12: 110 mg via INTRAVENOUS

## 2018-11-12 MED ORDER — HYDROMORPHONE HCL 1 MG/ML IJ SOLN
INTRAMUSCULAR | Status: DC | PRN
  Administered 2018-11-12: 0.5 mg via INTRAVENOUS

## 2018-11-12 MED ORDER — DEXAMETHASONE SODIUM PHOSPHATE 10 MG/ML IJ SOLN
INTRAMUSCULAR | Status: DC | PRN
  Administered 2018-11-12: 10 mg via INTRAVENOUS

## 2018-11-12 MED ORDER — ROCURONIUM BROMIDE 100 MG/10ML IV SOLN
INTRAVENOUS | Status: DC | PRN
  Administered 2018-11-12: 50 mg via INTRAVENOUS

## 2018-11-12 MED ORDER — FENTANYL CITRATE (PF) 100 MCG/2ML IJ SOLN: 25.0000 ug | INTRAMUSCULAR | Status: DC | PRN

## 2018-11-12 MED ORDER — ETOMIDATE 2 MG/ML IV SOLN
INTRAVENOUS | Status: AC
  Filled 2018-11-12: qty 10

## 2018-11-12 MED ORDER — SUGAMMADEX SODIUM 200 MG/2ML IV SOLN
INTRAVENOUS | Status: AC
  Filled 2018-11-12: qty 2

## 2018-11-12 MED ORDER — LIDOCAINE 2% (20 MG/ML) 5 ML SYRINGE
INTRAMUSCULAR | Status: AC
  Filled 2018-11-12: qty 5

## 2018-11-12 MED ORDER — FENTANYL CITRATE (PF) 250 MCG/5ML IJ SOLN
INTRAMUSCULAR | Status: AC
  Filled 2018-11-12: qty 5

## 2018-11-12 MED ORDER — ROCURONIUM BROMIDE 100 MG/10ML IV SOLN
INTRAVENOUS | Status: AC
  Filled 2018-11-12: qty 1

## 2018-11-12 MED ORDER — IOPAMIDOL (ISOVUE-300) INJECTION 61%
INTRAVENOUS | Status: AC
  Filled 2018-11-12: qty 50

## 2018-11-12 MED ORDER — PROMETHAZINE HCL 25 MG/ML IJ SOLN: 6.2500 mg | INTRAMUSCULAR | Status: DC | PRN

## 2018-11-12 MED ORDER — SUGAMMADEX SODIUM 200 MG/2ML IV SOLN
INTRAVENOUS | Status: DC | PRN
  Administered 2018-11-12: 150 mg via INTRAVENOUS

## 2018-11-12 MED ORDER — BUPIVACAINE-EPINEPHRINE (PF) 0.25% -1:200000 IJ SOLN
INTRAMUSCULAR | Status: AC
  Filled 2018-11-12: qty 30

## 2018-11-12 SURGICAL SUPPLY — 31 items
ADH SKN CLS APL DERMABOND .7 (GAUZE/BANDAGES/DRESSINGS) ×1
APPLIER CLIP 5 13 M/L LIGAMAX5 (MISCELLANEOUS) ×3
APR CLP MED LRG 5 ANG JAW (MISCELLANEOUS) ×1
BAG SPEC RTRVL LRG 6X4 10 (ENDOMECHANICALS) ×1
CABLE HIGH FREQUENCY MONO STRZ (ELECTRODE) ×3 IMPLANT
CATH REDDICK CHOLANGI 4FR 50CM (CATHETERS) ×3 IMPLANT
CHLORAPREP W/TINT 26ML (MISCELLANEOUS) ×3 IMPLANT
CLIP APPLIE 5 13 M/L LIGAMAX5 (MISCELLANEOUS) ×1 IMPLANT
COVER MAYO STAND STRL (DRAPES) ×3 IMPLANT
COVER WAND RF STERILE (DRAPES) IMPLANT
DECANTER SPIKE VIAL GLASS SM (MISCELLANEOUS) ×3 IMPLANT
DERMABOND ADVANCED (GAUZE/BANDAGES/DRESSINGS) ×2
DERMABOND ADVANCED .7 DNX12 (GAUZE/BANDAGES/DRESSINGS) ×1 IMPLANT
DRAPE C-ARM 42X120 X-RAY (DRAPES) ×3 IMPLANT
ELECT REM PT RETURN 15FT ADLT (MISCELLANEOUS) ×3 IMPLANT
GLOVE BIO SURGEON STRL SZ7.5 (GLOVE) ×3 IMPLANT
GOWN STRL REUS W/TWL XL LVL3 (GOWN DISPOSABLE) ×9 IMPLANT
HEMOSTAT SURGICEL 4X8 (HEMOSTASIS) IMPLANT
IV CATH 14GX2 1/4 (CATHETERS) ×3 IMPLANT
KIT BASIN OR (CUSTOM PROCEDURE TRAY) ×3 IMPLANT
POUCH SPECIMEN RETRIEVAL 10MM (ENDOMECHANICALS) ×3 IMPLANT
SCISSORS LAP 5X35 DISP (ENDOMECHANICALS) ×3 IMPLANT
SET IRRIG TUBING LAPAROSCOPIC (IRRIGATION / IRRIGATOR) ×3 IMPLANT
SET TUBE SMOKE EVAC HIGH FLOW (TUBING) ×3 IMPLANT
SLEEVE XCEL OPT CAN 5 100 (ENDOMECHANICALS) ×6 IMPLANT
SUT MNCRL AB 4-0 PS2 18 (SUTURE) ×3 IMPLANT
TOWEL OR 17X26 10 PK STRL BLUE (TOWEL DISPOSABLE) ×3 IMPLANT
TOWEL OR NON WOVEN STRL DISP B (DISPOSABLE) ×3 IMPLANT
TRAY LAPAROSCOPIC (CUSTOM PROCEDURE TRAY) ×3 IMPLANT
TROCAR BLADELESS OPT 5 100 (ENDOMECHANICALS) ×3 IMPLANT
TROCAR XCEL BLUNT TIP 100MML (ENDOMECHANICALS) ×3 IMPLANT

## 2018-11-12 NOTE — Op Note (Signed)
11/12/2018  9:57 AM  PATIENT:  Karina Morris  73 y.o. female  PRE-OPERATIVE DIAGNOSIS:  cholecystitis  POST-OPERATIVE DIAGNOSIS:  CHOLECYSTITIS  PROCEDURE:  Procedure(s): LAPAROSCOPIC CHOLECYSTECTOMY WITH INTRAOPERATIVE CHOLANGIOGRAM (N/A)  SURGEON:  Surgeon(s) and Role:    * Jovita Kussmaul, MD - Primary  PHYSICIAN ASSISTANT:   ASSISTANTS: none   ANESTHESIA:   local and general  EBL:  100 mL   BLOOD ADMINISTERED:none  DRAINS: none   LOCAL MEDICATIONS USED:  MARCAINE     SPECIMEN:  Source of Specimen:  gallbladder  DISPOSITION OF SPECIMEN:  PATHOLOGY  COUNTS:  YES  TOURNIQUET:  * No tourniquets in log *  DICTATION: .Dragon Dictation     Procedure: After informed consent was obtained the patient was brought to the operating room and placed in the supine position on the operating room table. After adequate induction of general anesthesia the patient's abdomen was prepped with ChloraPrep allowed to dry and draped in usual sterile manner. An appropriate timeout was performed. The area below the umbilicus was infiltrated with quarter percent  Marcaine. A small incision was made with a 15 blade knife. The incision was carried down through the subcutaneous tissue bluntly with a hemostat and [employer] retractors. The linea alba was identified. The linea alba was incised with a 15 blade knife and each side was grasped with Coker clamps. The preperitoneal space was then probed with a hemostat until the peritoneum was opened and access was gained to the abdominal cavity. A 0 Vicryl pursestring stitch was placed in the fascia surrounding the opening. A Hassan cannula was then placed through the opening and anchored in place with the previously placed Vicryl purse string stitch. The abdomen was insufflated with carbon dioxide without difficulty. A laparoscope was inserted through the [facility] cannula in the right upper quadrant was inspected. Next the epigastric region was infiltrated with  % Marcaine. A small incision was made with a 15 blade knife. A 5 mm port was placed bluntly through this incision into the abdominal cavity under direct vision. Next 2 sites were chosen laterally on the right side of the abdomen for placement of 5 mm ports. Each of these areas was infiltrated with quarter percent Marcaine. Small stab incisions were made with a 15 blade knife. 5 mm ports were then placed bluntly through these incisions into the abdominal cavity under direct vision without difficulty. A blunt grasper was placed through the lateralmost 5 mm port and used to grasp the dome of the gallbladder and elevated anteriorly and superiorly. Another blunt grasper was placed through the other 5 mm port and used to retract the body and neck of the gallbladder. A dissector was placed through the epigastric port and using the electrocautery the peritoneal reflection at the gallbladder neck was opened. Blunt dissection was then carried out in this area until the gallbladder neck-cystic duct junction was readily identified and a good window was created. A single clip was placed on the gallbladder neck. A small  ductotomy was made just below the clip with laparoscopic scissors. A 14-gauge Angiocath was then placed through the anterior abdominal wall under direct vision. A Reddick cholangiogram catheter was then placed through the Angiocath and flushed. The catheter was then placed in the cystic duct and anchored in place with a clip. A cholangiogram was obtained that showed no filling defects, small emptying into the duodenum and adequate length on the cystic duct. The anchoring clip and catheters were then removed from the patient. 3 clips  were placed proximally on the cystic duct and the duct was divided between the 2 sets of clips. Posterior to this the cystic artery was identified and again dissected bluntly in a circumferential manner until a good window  was created. 2 clips were placed proximally and one distally  on the artery and the artery was divided between the 2 sets of clips. Next a laparoscopic hook cautery device was used to separate the gallbladder from the liver bed. Prior to completely detaching the gallbladder from the liver bed the liver bed was inspected and several small bleeding points were coagulated with the electrocautery until the area was completely hemostatic. The gallbladder was then detached the rest of it from the liver bed without difficulty. A laparoscopic bag was inserted through the hassan port. The laparoscope was moved to the epigastric port. The gallbladder was placed within the bag and the bag was sealed.  The bag with the gallbladder was then removed with the [facility] cannula through the infraumbilical port without difficulty. The fascial defect was then closed with the previously placed Vicryl pursestring stitch as well as with another figure-of-eight 0 Vicryl stitch. The liver bed was inspected again and found to be hemostatic. The abdomen was irrigated with copious amounts of saline until the effluent was clear. The ports were then removed under direct vision without difficulty and were found to be hemostatic. The gas was allowed to escape. The skin incisions were all closed with interrupted 4-0 Monocryl subcuticular stitches. Dermabond dressings were applied. The patient tolerated the procedure well. At the end of the case all needle sponge and instrument counts were correct. The patient was then awakened and taken to recovery in stable condition  PLAN OF CARE: Admit to inpatient   PATIENT DISPOSITION:  PACU - hemodynamically stable.   Delay start of Pharmacological VTE agent (>24hrs) due to surgical blood loss or risk of bleeding: no

## 2018-11-12 NOTE — Anesthesia Procedure Notes (Signed)
Procedure Name: Intubation Date/Time: 11/12/2018 8:21 AM Performed by: Sharlette Dense, CRNA Patient Re-evaluated:Patient Re-evaluated prior to induction Oxygen Delivery Method: Circle system utilized Preoxygenation: Pre-oxygenation with 100% oxygen Induction Type: IV induction Ventilation: Mask ventilation without difficulty and Oral airway inserted - appropriate to patient size Laryngoscope Size: Sabra Heck and 2 Grade View: Grade I Tube type: Oral Tube size: 7.5 mm Number of attempts: 1 Airway Equipment and Method: Stylet Placement Confirmation: ETT inserted through vocal cords under direct vision,  positive ETCO2 and breath sounds checked- equal and bilateral Secured at: 21 cm Tube secured with: Tape Dental Injury: Teeth and Oropharynx as per pre-operative assessment

## 2018-11-12 NOTE — Anesthesia Preprocedure Evaluation (Signed)
Anesthesia Evaluation  Patient identified by MRN, date of birth, ID band Patient awake    Reviewed: Allergy & Precautions, NPO status , Patient's Chart, lab work & pertinent test results  Airway Mallampati: II  TM Distance: >3 FB Neck ROM: Full    Dental no notable dental hx.    Pulmonary neg pulmonary ROS,    Pulmonary exam normal breath sounds clear to auscultation       Cardiovascular negative cardio ROS Normal cardiovascular exam Rhythm:Regular Rate:Normal     Neuro/Psych Anxiety negative neurological ROS     GI/Hepatic negative GI ROS, Neg liver ROS,   Endo/Other  negative endocrine ROS  Renal/GU negative Renal ROS  negative genitourinary   Musculoskeletal negative musculoskeletal ROS (+)   Abdominal   Peds negative pediatric ROS (+)  Hematology negative hematology ROS (+)   Anesthesia Other Findings   Reproductive/Obstetrics negative OB ROS                             Anesthesia Physical Anesthesia Plan  ASA: II  Anesthesia Plan: General   Post-op Pain Management:    Induction: Intravenous  PONV Risk Score and Plan: 3 and Ondansetron, Dexamethasone and Treatment may vary due to age or medical condition  Airway Management Planned: Oral ETT  Additional Equipment:   Intra-op Plan:   Post-operative Plan: Extubation in OR  Informed Consent: I have reviewed the patients History and Physical, chart, labs and discussed the procedure including the risks, benefits and alternatives for the proposed anesthesia with the patient or authorized representative who has indicated his/her understanding and acceptance.     Dental advisory given  Plan Discussed with: CRNA and Surgeon  Anesthesia Plan Comments:         Anesthesia Quick Evaluation

## 2018-11-12 NOTE — Anesthesia Postprocedure Evaluation (Signed)
Anesthesia Post Note  Patient: Karina Morris  Procedure(s) Performed: LAPAROSCOPIC CHOLECYSTECTOMY WITH POSSIBLE INTRAOPERATIVE CHOLANGIOGRAM (N/A Abdomen)     Patient location during evaluation: PACU Anesthesia Type: General Level of consciousness: awake and alert Pain management: pain level controlled Vital Signs Assessment: post-procedure vital signs reviewed and stable Respiratory status: spontaneous breathing, nonlabored ventilation, respiratory function stable and patient connected to nasal cannula oxygen Cardiovascular status: blood pressure returned to baseline and stable Postop Assessment: no apparent nausea or vomiting Anesthetic complications: no    Last Vitals:  Vitals:   11/12/18 1045 11/12/18 1107  BP: (!) 141/62 (!) 142/58  Pulse: 91 84  Resp: 17 18  Temp: 36.8 C (!) 36.4 C  SpO2: 97% 95%    Last Pain:  Vitals:   11/12/18 1107  TempSrc: Oral  PainSc: 0-No pain                 Darnel Mchan S

## 2018-11-12 NOTE — Transfer of Care (Signed)
Immediate Anesthesia Transfer of Care Note  Patient: Karina Morris  Procedure(s) Performed: LAPAROSCOPIC CHOLECYSTECTOMY WITH POSSIBLE INTRAOPERATIVE CHOLANGIOGRAM (N/A Abdomen)  Patient Location: PACU  Anesthesia Type:General  Level of Consciousness: drowsy  Airway & Oxygen Therapy: Patient Spontanous Breathing and Patient connected to face mask oxygen  Post-op Assessment: Report given to RN and Post -op Vital signs reviewed and stable  Post vital signs: Reviewed and stable  Last Vitals:  Vitals Value Taken Time  BP 146/59 11/12/2018 10:12 AM  Temp    Pulse 104 11/12/2018 10:13 AM  Resp 13 11/12/2018 10:13 AM  SpO2 100 % 11/12/2018 10:13 AM  Vitals shown include unvalidated device data.  Last Pain:  Vitals:   11/12/18 0745  TempSrc: Oral  PainSc: 9          Complications: No apparent anesthesia complications

## 2018-11-12 NOTE — Progress Notes (Signed)
Day of Surgery   Subjective/Chief Complaint: Complains of RUQ pain   Objective: Vital signs in last 24 hours: Temp:  [98.3 F (36.8 C)-100.1 F (37.8 C)] 99 F (37.2 C) (01/20 0745) Pulse Rate:  [85-107] 96 (01/20 0528) Resp:  [16-23] 18 (01/20 0528) BP: (139-172)/(58-88) 139/67 (01/20 0528) SpO2:  [93 %-100 %] 93 % (01/20 0528) Weight:  [68.9 kg] 68.9 kg (01/20 0745) Last BM Date: 11/11/18  Intake/Output from previous day: 01/19 0701 - 01/20 0700 In: 877.4 [P.O.:120; I.V.:707.4; IV Piggyback:49.9] Out: 350 [Urine:350] Intake/Output this shift: No intake/output data recorded.  General appearance: alert and cooperative Resp: clear to auscultation bilaterally Cardio: regular rate and rhythm GI: mod RUQ pain  Lab Results:  Recent Labs    11/11/18 1233 11/12/18 0349  WBC 15.1* 13.2*  HGB 14.1 12.4  HCT 43.7 38.9  PLT 171 135*   BMET Recent Labs    11/11/18 1233 11/12/18 0349  NA 137 137  K 3.6 4.2  CL 106 107  CO2 21* 19*  GLUCOSE 141* 107*  BUN 15 9  CREATININE 0.68 0.66  CALCIUM 8.7* 8.4*   PT/INR No results for input(s): LABPROT, INR in the last 72 hours. ABG No results for input(s): PHART, HCO3 in the last 72 hours.  Invalid input(s): PCO2, PO2  Studies/Results: Dg Chest 2 View  Result Date: 11/11/2018 CLINICAL DATA:  Chest pain radiating to the back last night. EXAM: CHEST - 2 VIEW COMPARISON:  None. FINDINGS: The heart size and mediastinal contours are within normal limits. Both lungs are clear. The visualized skeletal structures are unremarkable. Bilateral implants are noted. IMPRESSION: No active cardiopulmonary disease. Electronically Signed   By: Abelardo Diesel M.D.   On: 11/11/2018 13:17   US Abdomen Limited Ruq  Result Date: 11/11/2018 CLINICAL DATA:  73 year old with right upper quadrant pain. Vomiting. EXAM: ULTRASOUND ABDOMEN LIMITED RIGHT UPPER QUADRANT COMPARISON:  CT from 03/17/2013 FINDINGS: Gallbladder: Non mobile stone at the base  of the gallbladder with posterior acoustic shadowing. Stone measures up to 1.8 cm. The gallbladder is mildly distended. The gallbladder wall is irregular and thickened, measuring up to 4 mm. In addition, there is pericholecystic fluid. Positive for a sonographic Murphy sign. Common bile duct: Diameter: 6 mm Liver: Increased echogenicity in the liver. Poor definition of the internal architecture and compatible with hepatic steatosis. Indeterminate hypoechoic structure in the left hepatic lobe measures 1.0 x 0.8 x 0.6 cm. This structure is not compatible with a simple cyst. Portal vein is patent on color Doppler imaging with normal direction of blood flow towards the liver. IMPRESSION: 1. Findings compatible with the acute cholecystitis with cholelithiasis. Gallbladder is distended with irregular wall thickening, non mobile gallstone, Murphy sign and pericholecystic fluid. 2. Hepatic steatosis. Increased echogenicity in the liver with poor internal architecture. 3. Indeterminate 1.0 cm hypoechoic structure in the left hepatic lobe. This is not compatible with a simple cyst. Based on the extensive hepatic steatosis, this could represent a small focus of fat sparing. Due to the small size of this area, consider surveillance with ultrasound. Electronically Signed   By: Markus Daft M.D.   On: 11/11/2018 15:01    Anti-infectives: Anti-infectives (From admission, onward)   Start     Dose/Rate Route Frequency Ordered Stop   11/11/18 1700  [MAR Hold]  piperacillin-tazobactam (ZOSYN) IVPB 3.375 g     (MAR Hold since Mon 11/12/2018 at 0735. Reason: Transfer to a Procedural area.)   3.375 g 12.5 mL/hr over 240 Minutes  Intravenous Every 8 hours 11/11/18 1601        Assessment/Plan: s/p Procedure(s): LAPAROSCOPIC CHOLECYSTECTOMY WITH POSSIBLE INTRAOPERATIVE CHOLANGIOGRAM (N/A) plan for lap chole with ioc today. Risks and benefits of the surgery as well as some of the technical aspects discussed with the patient and  she understands and wishes to proceed  LOS: 0 days    Karina Morris 11/12/2018

## 2018-11-12 NOTE — Progress Notes (Signed)
Temp 100.1 @ 0600, administered 650 mg Tylenol @0615 

## 2018-11-13 ENCOUNTER — Encounter (HOSPITAL_COMMUNITY): Payer: Self-pay | Admitting: General Surgery

## 2018-11-13 LAB — COMPREHENSIVE METABOLIC PANEL
ALT: 226 U/L — ABNORMAL HIGH (ref 0–44)
AST: 126 U/L — ABNORMAL HIGH (ref 15–41)
Albumin: 3 g/dL — ABNORMAL LOW (ref 3.5–5.0)
Alkaline Phosphatase: 61 U/L (ref 38–126)
Anion gap: 7 (ref 5–15)
BUN: 9 mg/dL (ref 8–23)
CO2: 23 mmol/L (ref 22–32)
Calcium: 8.1 mg/dL — ABNORMAL LOW (ref 8.9–10.3)
Chloride: 109 mmol/L (ref 98–111)
Creatinine, Ser: 0.67 mg/dL (ref 0.44–1.00)
GFR calc Af Amer: >60 mL/min (ref >60)
GFR calc non Af Amer: >60 mL/min (ref >60)
Glucose, Bld: 112 mg/dL — ABNORMAL HIGH (ref 70–99)
Potassium: 3.6 mmol/L (ref 3.5–5.1)
SODIUM: 139 mmol/L (ref 135–145)
Total Bilirubin: 0.7 mg/dL (ref 0.3–1.2)
Total Protein: 5.9 g/dL — ABNORMAL LOW (ref 6.5–8.1)

## 2018-11-13 MED ORDER — ACETAMINOPHEN 325 MG PO TABS: 650.0000 mg | ORAL_TABLET | Freq: Four times a day (QID) | ORAL | Status: AC | PRN

## 2018-11-13 MED ORDER — IBUPROFEN 200 MG PO TABS: ORAL_TABLET | ORAL | Status: AC

## 2018-11-13 MED ORDER — TRAMADOL HCL 50 MG PO TABS: ORAL_TABLET | ORAL | 0 refills | Status: DC

## 2018-11-13 NOTE — Discharge Summary (Signed)
Physician Discharge Summary  Patient ID: Karina Morris MRN: [id number] DOB/AGE: 1946-08-27 73 y.o.  Admit date: 11/11/2018 Discharge date: 11/13/2018  Admission Diagnoses:  Acute cholecystitis  Discharge Diagnoses:  Acute cholecystitis  Active Problems:   Cholecystitis   PROCEDURES: Laparoscopic cholecystectomy with intraoperative cholangiogram, 11/12/2018 Dr. Governor Specking Course:  Very pleasant and healthy 73yo woman who developed lower chest pain yesterday evening following a salad for dinner. The pain radiated to her back and then began to involve the right side of the abdomen. It was constant and she felt like it may be relieved if she could have a bowel movement. She was unable to sleep all night due to pain. This morning the chest pain was improved but the abdominal pain persisted. She tried to have some coffee and toast and then experienced nausea and emesis. No fevers. No prior similar symptoms.  No prior abdominal surgery.   Work-up in the ED showed she was afebrile except for a temperature later that evening of 100.  Vital signs are stable.  Labs on admission were normal except for slightly elevated glucose of 141 and a white count of 15.1.  Chest x-ray was normal.  Nominal ultrasound showed a nonmobile stone at the base of the gallbladder with the stone measuring 1.8 cm.  Gallbladder was mildly distended.  The gallbladder wall is irregular and thickened measuring up to 4 mm.  In addition there is pericholecystic fluid positive for a sonographic Murphy sign.  Common bile duct was 6 mm.  Also hepatic steatosis.  There was an indeterminate 1 cm high pericolic structure in the left hepatic lobe which which could represent a small focus of fat sparing.  Radiology recommended surveillance.    Patient was admitted and placed on IV antibiotics by Dr. Windle Guard.  Laparoscopic cholecystectomy was discussed, and patient was set up and taken the operating room the following a.m. by Dr. Autumn Messing.  IOC:  No definitive evidence of choledocholithiasis however there is suboptimal opacification of the distal aspect of the CBD.  We rechecked the LFT's post op and they are stable, but we plan to recheck on 11/23/18, as an outpatient before she returns to the clinic on 11/27/18.    CBC Latest Ref Rng & Units 11/12/2018 11/11/2018 03/17/2013  WBC 4.0 - 10.5 K/uL 13.2(H) 15.1(H) -  Hemoglobin 12.0 - 15.0 g/dL 12.4 14.1 13.3  Hematocrit 36.0 - 46.0 % 38.9 43.7 39.0  Platelets 150 - 400 K/uL 135(L) 171 -   CMP Latest Ref Rng & Units 11/13/2018 11/12/2018 11/11/2018  Glucose 70 - 99 mg/dL 112(H) 107(H) 141(H)  BUN 8 - 23 mg/dL 9 9 15   Creatinine 0.44 - 1.00 mg/dL 0.67 0.66 0.68  Sodium 135 - 145 mmol/L 139 137 137  Potassium 3.5 - 5.1 mmol/L 3.6 4.2 3.6  Chloride 98 - 111 mmol/L 109 107 106  CO2 22 - 32 mmol/L 23 19(L) 21(L)  Calcium 8.9 - 10.3 mg/dL 8.1(L) 8.4(L) 8.7(L)  Total Protein 6.5 - 8.1 g/dL 5.9(L) 6.3(L) 8.0  Total Bilirubin 0.3 - 1.2 mg/dL 0.7 1.0 0.6  Alkaline Phos 38 - 126 U/L 61 61 55  AST 15 - 41 U/L 126(H) 224(H) 35  ALT 0 - 44 U/L 226(H) 219(H) 35    Abdominal ultrasound 11/11/18:  1. Findings compatible with the acute cholecystitis with cholelithiasis. Gallbladder is distended with irregular wall thickening, non mobile gallstone, Murphy sign and pericholecystic fluid. 2. Hepatic steatosis. Increased echogenicity in the liver with  poor internal architecture. 3. Indeterminate 1.0 cm hypoechoic structure in the left hepatic lobe. This is not compatible with a simple cyst. Based on the extensive hepatic steatosis, this could represent a small focus of fat sparing. Due to the small size of this area, consider surveillance with ultrasound.  IOC 11/12/18"Intraoperative cholangiographic images of the right upper abdominal quadrant during laparoscopic cholecystectomy are provided for Review.  Surgical clips overlie the expected location of the gallbladder fossa. Contrast  injection demonstrates selective cannulation of the central aspect of the cystic duct.  There is passage of contrast through the central aspect of the cystic duct with filling of a non dilated common bile duct. There is passage of contrast though the CBD with faint opacification of the duodenum, however the distal aspect of the CBD is suboptimally opacified. There is minimal reflux of injected contrast into the common hepatic duct and central aspect of the non dilated intrahepatic biliary system. There are no definitive filling defects within the opacified portions of the biliary system to suggest the presence of choledocholithiasis, however as above, there is suboptimal opacification the distal aspect of the CBD.  IMPRESSION: No definitive evidence of choledocholithiasis however there is suboptimal opacification of the distal aspect of the CBD.   Condition on DC:  Improved   Disposition: Home   Allergies as of 11/13/2018   No Known Allergies     Medication List    TAKE these medications   acetaminophen 325 MG tablet Commonly known as:  TYLENOL Take 2 tablets (650 mg total) by mouth every 6 (six) hours as needed for mild pain (or temp > 100).   aspirin 81 MG chewable tablet Chew 81 mg by mouth daily.   estrogens (conjugated) 0.3 MG tablet Commonly known as:  PREMARIN Take 0.3 mg by mouth daily. Take daily for 21 days then do not take for 7 days.   ibuprofen 200 MG tablet Commonly known as:  ADVIL,MOTRIN You can take 2-3 tablets every 6 hours as needed for pain, this is your second medicine for pain relief. You can buy this over the counter at any drug store.   LORazepam 1 MG tablet Commonly known as:  ATIVAN Take 0.5 mg by mouth 2 (two) times daily.   LYSINE PO Take 1 capsule by mouth daily.   OMEGA 3 PO Take 1 capsule by mouth daily.   progesterone 100 MG capsule Commonly known as:  PROMETRIUM Take 100 mg by mouth at bedtime.   traMADol 50 MG tablet Commonly  known as:  ULTRAM You can take one tablet every 6 hours as needed for pain not relieved by Tylenol(acetaminopnen) or ibuprofen.   Vitamin D 50 MCG (2000 UT) Caps Take 1 capsule by mouth daily.   zolpidem 10 MG tablet Commonly known as:  AMBIEN Take 10 mg by mouth at bedtime as needed for sleep.      Follow-up Information    [facility] [place] Follow up on 11/27/2018.   Specialty:  General Surgery Why:   Your appointment is at 9:15 AM.  Be at the office 30 minutes early for check in.  Bring photo ID and insurance information.  Have labs checked on 11/23/18. Contact information: [place] [address] [phone]        Maisie Fus, MD Follow up.   Specialty:  Obstetrics and Gynecology Why:  Call and let them know you had surgery, follow up for medical issues.  Have Dr. Nori Riis check your blood  studies on 11/23/18 and send a copy to our office, or have the studies done at an outpatient lab. Contact information: [place] [address] [phone]           Signed: Earnstine Regal 11/13/2018, 12:43 PM

## 2018-11-13 NOTE — Progress Notes (Signed)
1 Day Post-Op    CC:  Abdominal pain  Subjective: Tolerating clears, ready for a diet.  Sites look fine she has some bruising.  Objective: Vital signs in last 24 hours: Temp:  [97.5 F (36.4 C)-99.4 F (37.4 C)] 98.5 F (36.9 C) (01/21 0639) Pulse Rate:  [75-107] 81 (01/21 0639) Resp:  [13-22] 19 (01/21 0639) BP: (128-153)/(55-106) 150/66 (01/21 0639) SpO2:  [95 %-100 %] 99 % (01/21 0639) Last BM Date: 11/11/18 120 PO 2700 IV 2525 urine Afebrile vital signs are stable AST 35>> 224>> 26 ALT 35>> 219>> 226 Bilirubin 0.6>> 1.0>> 0.7 IOC:No definitive evidence of choledocholithiasis however there is suboptimal opacification of the distal aspect of the CBD.  Intake/Output from previous day: 01/20 0701 - 01/21 0700 In: 2975 [P.O.:120; I.V.:2702.2; IV Piggyback:152.8] Out: 2625 [Urine:2525; Blood:100] Intake/Output this shift: No intake/output data recorded.  General appearance: alert, cooperative and no distress Resp: clear to auscultation bilaterally GI: Cough, sore, port sites all look fine.  Does have some ecchymosis around the port sites.  Tolerating clear liquids well.  Lab Results:  Recent Labs    11/11/18 1233 11/12/18 0349  WBC 15.1* 13.2*  HGB 14.1 12.4  HCT 43.7 38.9  PLT 171 135*    BMET Recent Labs    11/12/18 0349 11/13/18 0342  NA 137 139  K 4.2 3.6  CL 107 109  CO2 19* 23  GLUCOSE 107* 112*  BUN 9 9  CREATININE 0.66 0.67  CALCIUM 8.4* 8.1*   PT/INR No results for input(s): LABPROT, INR in the last 72 hours.  Recent Labs  Lab 11/11/18 1233 11/12/18 0349 11/13/18 0342  AST 35 224* 126*  ALT 35 219* 226*  ALKPHOS 55 61 61  BILITOT 0.6 1.0 0.7  PROT 8.0 6.3* 5.9*  ALBUMIN 4.3 3.3* 3.0*     Lipase  No results found for: LIPASE   Prior to Admission medications   Medication Sig Start Date End Date Taking? Authorizing Provider  aspirin 81 MG chewable tablet Chew 81 mg by mouth daily.   Yes [provider]   Cholecalciferol (VITAMIN D) 2000 UNITS CAPS Take 1 capsule by mouth daily.    Yes [provider]  estrogens, conjugated, (PREMARIN) 0.3 MG tablet Take 0.3 mg by mouth daily. Take daily for 21 days then do not take for 7 days.   Yes [provider]  LORazepam (ATIVAN) 1 MG tablet Take 0.5 mg by mouth 2 (two) times daily. 09/08/18  Yes [provider]  LYSINE PO Take 1 capsule by mouth daily.   Yes [provider]  Omega-3 Fatty Acids (OMEGA 3 PO) Take 1 capsule by mouth daily.   Yes [provider]  progesterone (PROMETRIUM) 100 MG capsule Take 100 mg by mouth at bedtime. 09/08/18  Yes [provider]  zolpidem (AMBIEN) 10 MG tablet Take 10 mg by mouth at bedtime as needed for sleep.   Yes [provider]    Medications: . docusate sodium  100 mg Oral BID  . enoxaparin (LOVENOX) injection  40 mg Subcutaneous Q24H  . sodium chloride flush  3 mL Intravenous Once   . sodium chloride 125 mL/hr at 11/13/18 0600  . lactated ringers 10 mL/hr at 11/12/18 0753  . methocarbamol (ROBAXIN) IV    . piperacillin-tazobactam (ZOSYN)  IV 3.375 g (11/13/18 0902)   Anti-infectives (From admission, onward)   Start     Dose/Rate Route Frequency Ordered Stop   11/11/18 1700  piperacillin-tazobactam (ZOSYN) IVPB  3.375 g     3.375 g 12.5 mL/hr over 240 Minutes Intravenous Every 8 hours 11/11/18 1601        Assessment/Plan  Acute cholecystitis Laparoscopic cholecystectomy with intraoperative cholangiogram, 11/12/2018, Dr. Autumn Messing IOC:No definitive evidence of choledocholithiasis however there is suboptimal opacification of the distal aspect of the CBD.  FEN: IV fluids/clears ID: Zosyn 1/19 >> day 3 DVT: Lovenox Follow-up: [facility] clinic; recheck LFTs prior to her return visit.  Plan: Advance her diet, mobilize, saline lock IV, she does well home later today.  We will recheck her LFTs a day or 2 before she returns for her clinic visit.       LOS: 1 day    Santonio Speakman 11/13/2018 [phone]

## 2018-11-13 NOTE — Discharge Instructions (Signed)
[facility] ______[facility] °LAPAROSCOPIC SURGERY: POST OP INSTRUCTIONS °Always review your discharge instruction sheet given to you by the facility where your surgery was performed. °IF YOU HAVE DISABILITY OR FAMILY LEAVE FORMS, YOU MUST BRING THEM TO THE OFFICE FOR PROCESSING.   °DO NOT GIVE THEM TO YOUR DOCTOR. ° °1. A prescription for pain medication may be given to you upon discharge.  Take your pain medication as prescribed, if needed.  If narcotic pain medicine is not needed, then you may take acetaminophen (Tylenol) or ibuprofen (Advil) as needed. °2. Take your usually prescribed medications unless otherwise directed. °3. If you need a refill on your pain medication, please contact your pharmacy.  They will contact our office to request authorization. Prescriptions will not be filled after 5pm or on week-ends. °4. You should follow a light diet the first few days after arrival home, such as soup and crackers, etc.  Be sure to include lots of fluids daily. °5. Most patients will experience some swelling and bruising in the area of the incisions.  Ice packs will help.  Swelling and bruising can take several days to resolve.  °6. It is common to experience some constipation if taking pain medication after surgery.  Increasing fluid intake and taking a stool softener (such as Colace) will usually help or prevent this problem from occurring.  A mild laxative (Milk of Magnesia or Miralax) should be taken according to package instructions if there are no bowel movements after 48 hours. °7. Unless discharge instructions indicate otherwise, you may remove your bandages 24-48 hours after surgery, and you may shower at that time.  You may have steri-strips (small skin tapes) in place directly over the incision.  These strips should be left on the skin for 7-10 days.  If your surgeon used skin glue on the incision, you may shower in 24 hours.  The glue will flake off over the next 2-3 weeks.  Any sutures or  staples will be removed at the office during your follow-up visit. °8. ACTIVITIES:  You may resume regular (light) daily activities beginning the next day--such as daily self-care, walking, climbing stairs--gradually increasing activities as tolerated.  You may have sexual intercourse when it is comfortable.  Refrain from any heavy lifting or straining until approved by your doctor. °a. You may drive when you are no longer taking prescription pain medication, you can comfortably wear a seatbelt, and you can safely maneuver your car and apply brakes. °b. RETURN TO WORK:  __________________________________________________________ °9. You should see your doctor in the office for a follow-up appointment approximately 2-3 weeks after your surgery.  Make sure that you call for this appointment within a day or two after you arrive home to insure a convenient appointment time. °10. OTHER INSTRUCTIONS: __________________________________________________________________________________________________________________________ __________________________________________________________________________________________________________________________ °WHEN TO CALL YOUR DOCTOR: °1. Fever over 101.0 °2. Inability to urinate °3. Continued bleeding from incision. °4. Increased pain, redness, or drainage from the incision. °5. Increasing abdominal pain ° °The clinic staff is available to answer your questions during regular business hours.  Please don’t hesitate to call and ask to speak to one of the nurses for clinical concerns.  If you have a medical emergency, go to the nearest emergency room or call 911.  A surgeon from [facility] is always on call at the hospital. °[address], [address], [place], [place]  [address] ? [address] [address], [place],    [address] °[phone] ? [phone] ? FAX [phone] °Web site:   [email or web address]     Managing Your Pain After Surgery Without  Opioids    Thank you for participating in our program to help patients manage their pain after surgery without opioids. This is part of our effort to provide you with the best care possible, without exposing you or your family to the risk that opioids pose.  What pain can I expect after surgery? You can expect to have some pain after surgery. This is normal. The pain is typically worse the day after surgery, and quickly begins to get better. Many studies have found that many patients are able to manage their pain after surgery with Over-the-Counter (OTC) medications such as Tylenol and Motrin. If you have a condition that does not allow you to take Tylenol or Motrin, notify your surgical team.  How will I manage my pain? The best strategy for controlling your pain after surgery is around the clock pain control with Tylenol (acetaminophen) and Motrin (ibuprofen or Advil). Alternating these medications with each other allows you to maximize your pain control. In addition to Tylenol and Motrin, you can use heating pads or ice packs on your incisions to help reduce your pain.  How will I alternate your regular strength over-the-counter pain medication? You will take a dose of pain medication every three hours. ; Start by taking 650 mg of Tylenol (2 pills of 325 mg) ; 3 hours later take 600 mg of Motrin (3 pills of 200 mg) ; 3 hours after taking the Motrin take 650 mg of Tylenol ; 3 hours after that take 600 mg of Motrin.   - 1 -  See example - if your first dose of Tylenol is at 12:00 PM   12:00 PM Tylenol 650 mg (2 pills of 325 mg)  3:00 PM Motrin 600 mg (3 pills of 200 mg)  6:00 PM Tylenol 650 mg (2 pills of 325 mg)  9:00 PM Motrin 600 mg (3 pills of 200 mg)  Continue alternating every 3 hours   We recommend that you follow this schedule around-the-clock for at least 3 days after surgery, or until you feel that it is no longer needed. Use the table on the last page of this handout to  keep track of the medications you are taking. Important: Do not take more than 3000mg  of Tylenol or 2400 mg of Motrin in a 24-hour period. Do not take ibuprofen/Motrin if you have a history of bleeding stomach ulcers, severe kidney disease, &/or actively taking a blood thinner  What if I still have pain? If you have pain that is not controlled with the over-the-counter pain medications (Tylenol and Motrin or Advil) you might have what we call breakthrough pain. You will receive a prescription for a small amount of an opioid pain medication such as Oxycodone, Tramadol, or Tylenol with Codeine. Use these opioid pills in the first 24 hours after surgery if you have breakthrough pain. Do not take more than 1 pill every 4-6 hours.  If you still have uncontrolled pain after using all opioid pills, don't hesitate to call our staff using the number provided. We will help make sure you are managing your pain in the best way possible, and if necessary, we can provide a prescription for additional pain medication.   Day 1    Time  Name of Medication Number of pills taken  Amount of Acetaminophen  Pain Level   Comments  AM PM       AM PM  AM PM       AM PM       AM PM       AM PM       AM PM       AM PM       Total Daily amount of Acetaminophen Do not take more than  3,000 mg per day      Day 2    Time  Name of Medication Number of pills taken  Amount of Acetaminophen  Pain Level   Comments  AM PM       AM PM       AM PM       AM PM       AM PM       AM PM       AM PM       AM PM       Total Daily amount of Acetaminophen Do not take more than  3,000 mg per day      Day 3    Time  Name of Medication Number of pills taken  Amount of Acetaminophen  Pain Level   Comments  AM PM       AM PM       AM PM       AM PM          AM PM       AM PM       AM PM       AM PM       Total Daily amount of Acetaminophen Do not take more than  3,000 mg per day       Day 4    Time  Name of Medication Number of pills taken  Amount of Acetaminophen  Pain Level   Comments  AM PM       AM PM       AM PM       AM PM       AM PM       AM PM       AM PM       AM PM       Total Daily amount of Acetaminophen Do not take more than  3,000 mg per day      Day 5    Time  Name of Medication Number of pills taken  Amount of Acetaminophen  Pain Level   Comments  AM PM       AM PM       AM PM       AM PM       AM PM       AM PM       AM PM       AM PM       Total Daily amount of Acetaminophen Do not take more than  3,000 mg per day       Day 6    Time  Name of Medication Number of pills taken  Amount of Acetaminophen  Pain Level  Comments  AM PM       AM PM       AM PM       AM PM       AM PM       AM PM       AM PM       AM PM       Total Daily amount  of Acetaminophen Do not take more than  3,000 mg per day      Day 7    Time  Name of Medication Number of pills taken  Amount of Acetaminophen  Pain Level   Comments  AM PM       AM PM       AM PM       AM PM       AM PM       AM PM       AM PM       AM PM       Total Daily amount of Acetaminophen Do not take more than  3,000 mg per day        For additional information about how and where to safely dispose of unused opioid medications - [employer]  Disclaimer: This document contains information and/or instructional materials adapted from [place] for the typical patient with your condition. It does not replace medical advice from your health care provider because your experience may differ from that of the typical patient. Talk to your health care provider if you have any questions about this document, your condition or your treatment plan. Adapted from [place]

## 2018-11-13 NOTE — Progress Notes (Signed)
Reviewed patient d/c instructions.  Pt verbalized understanding and restated them.  IV removed.

## 2018-11-23 DIAGNOSIS — R945 Abnormal results of liver function studies: Secondary | ICD-10-CM | POA: Diagnosis not present

## 2019-05-15 DIAGNOSIS — Z01419 Encounter for gynecological examination (general) (routine) without abnormal findings: Secondary | ICD-10-CM | POA: Diagnosis not present

## 2019-07-08 DIAGNOSIS — Z23 Encounter for immunization: Secondary | ICD-10-CM | POA: Diagnosis not present

## 2019-07-10 ENCOUNTER — Other Ambulatory Visit: Payer: Self-pay | Admitting: Obstetrics & Gynecology

## 2019-07-10 DIAGNOSIS — Z1231 Encounter for screening mammogram for malignant neoplasm of breast: Secondary | ICD-10-CM

## 2019-08-26 ENCOUNTER — Ambulatory Visit
Admission: RE | Admit: 2019-08-26 | Discharge: 2019-08-26 | Disposition: A | Discharge status: Home / Self Care | Payer: Medicare Other | Source: Ambulatory Visit | Attending: Obstetrics & Gynecology | Admitting: Obstetrics & Gynecology

## 2019-08-26 ENCOUNTER — Other Ambulatory Visit: Payer: Self-pay

## 2019-08-26 DIAGNOSIS — Z1231 Encounter for screening mammogram for malignant neoplasm of breast: Secondary | ICD-10-CM | POA: Diagnosis not present

## 2019-12-07 ENCOUNTER — Ambulatory Visit: Payer: Medicare Other | Attending: Internal Medicine

## 2019-12-07 DIAGNOSIS — Z23 Encounter for immunization: Secondary | ICD-10-CM

## 2019-12-07 NOTE — Progress Notes (Signed)
   Covid-19 Vaccination Clinic  Name:  Karina Morris    MRN: [id number] DOB: 1946/08/30  12/07/2019  [name] was observed post Covid-19 immunization for 15 minutes without incidence. She was provided with Vaccine Information Sheet and instruction to access the V-Safe system.   [name] was instructed to call 911 with any severe reactions post vaccine: Marland Kitchen Difficulty breathing  . Swelling of your face and throat  . A fast heartbeat  . A bad rash all over your body  . Dizziness and weakness    Immunizations Administered    Name Date Dose VIS Date Route   Pfizer COVID-19 Vaccine 12/07/2019  9:05 AM 0.3 mL 10/04/2019 Intramuscular   Manufacturer: [place]   Lot: [id number]   [place]: [id number]

## 2019-12-29 ENCOUNTER — Ambulatory Visit: Payer: Medicare Other | Attending: Internal Medicine

## 2019-12-29 DIAGNOSIS — Z23 Encounter for immunization: Secondary | ICD-10-CM | POA: Insufficient documentation

## 2019-12-29 NOTE — Progress Notes (Signed)
   Covid-19 Vaccination Clinic  Name:  CHAYNE ZANIEWSKI    MRN: [id number] DOB: January 07, 1946  12/29/2019  Ms. Zinno was observed post Covid-19 immunization for 15 minutes without incident. She was provided with Vaccine Information Sheet and instruction to access the V-Safe system.   Ms. Tonne was instructed to call 911 with any severe reactions post vaccine: Marland Kitchen Difficulty breathing  . Swelling of face and throat  . A fast heartbeat  . A bad rash all over body  . Dizziness and weakness   Immunizations Administered    Name Date Dose VIS Date Route   Pfizer COVID-19 Vaccine 12/29/2019 11:59 AM 0.3 mL 10/04/2019 Intramuscular   Manufacturer: [place]   Lot: [id number]   [place]: [id number]

## 2020-01-08 DIAGNOSIS — M25552 Pain in left hip: Secondary | ICD-10-CM | POA: Diagnosis not present

## 2020-05-18 DIAGNOSIS — Z779 Other contact with and (suspected) exposures hazardous to health: Secondary | ICD-10-CM | POA: Diagnosis not present

## 2020-05-18 DIAGNOSIS — Z124 Encounter for screening for malignant neoplasm of cervix: Secondary | ICD-10-CM | POA: Diagnosis not present

## 2020-05-18 DIAGNOSIS — Z6828 Body mass index (BMI) 28.0-28.9, adult: Secondary | ICD-10-CM | POA: Diagnosis not present

## 2020-06-26 DIAGNOSIS — Z23 Encounter for immunization: Secondary | ICD-10-CM | POA: Diagnosis not present

## 2020-07-23 ENCOUNTER — Other Ambulatory Visit: Payer: Self-pay | Admitting: Obstetrics & Gynecology

## 2020-07-23 DIAGNOSIS — Z1231 Encounter for screening mammogram for malignant neoplasm of breast: Secondary | ICD-10-CM

## 2020-08-07 DIAGNOSIS — Z23 Encounter for immunization: Secondary | ICD-10-CM | POA: Diagnosis not present

## 2020-08-26 ENCOUNTER — Ambulatory Visit
Admission: RE | Admit: 2020-08-26 | Discharge: 2020-08-26 | Disposition: A | Discharge status: Home / Self Care | Payer: Medicare Other | Source: Ambulatory Visit | Attending: Obstetrics & Gynecology | Admitting: Obstetrics & Gynecology

## 2020-08-26 ENCOUNTER — Other Ambulatory Visit: Payer: Self-pay

## 2020-08-26 DIAGNOSIS — Z1231 Encounter for screening mammogram for malignant neoplasm of breast: Secondary | ICD-10-CM | POA: Diagnosis not present

## 2020-09-04 DIAGNOSIS — Z23 Encounter for immunization: Secondary | ICD-10-CM | POA: Diagnosis not present

## 2021-01-27 DIAGNOSIS — C4401 Basal cell carcinoma of skin of lip: Secondary | ICD-10-CM | POA: Diagnosis not present

## 2021-01-27 DIAGNOSIS — B351 Tinea unguium: Secondary | ICD-10-CM | POA: Diagnosis not present

## 2021-01-27 DIAGNOSIS — L820 Inflamed seborrheic keratosis: Secondary | ICD-10-CM | POA: Diagnosis not present

## 2021-01-27 DIAGNOSIS — D485 Neoplasm of uncertain behavior of skin: Secondary | ICD-10-CM | POA: Diagnosis not present

## 2021-01-27 DIAGNOSIS — L218 Other seborrheic dermatitis: Secondary | ICD-10-CM | POA: Diagnosis not present

## 2021-02-18 DIAGNOSIS — B351 Tinea unguium: Secondary | ICD-10-CM | POA: Diagnosis not present

## 2021-04-01 DIAGNOSIS — C4401 Basal cell carcinoma of skin of lip: Secondary | ICD-10-CM | POA: Diagnosis not present

## 2021-04-08 DIAGNOSIS — L905 Scar conditions and fibrosis of skin: Secondary | ICD-10-CM | POA: Diagnosis not present

## 2021-04-28 DIAGNOSIS — Z85828 Personal history of other malignant neoplasm of skin: Secondary | ICD-10-CM | POA: Diagnosis not present

## 2021-04-28 DIAGNOSIS — B351 Tinea unguium: Secondary | ICD-10-CM | POA: Diagnosis not present

## 2021-05-11 DIAGNOSIS — H5213 Myopia, bilateral: Secondary | ICD-10-CM | POA: Diagnosis not present

## 2021-05-11 DIAGNOSIS — H25813 Combined forms of age-related cataract, bilateral: Secondary | ICD-10-CM | POA: Diagnosis not present

## 2021-05-11 DIAGNOSIS — H524 Presbyopia: Secondary | ICD-10-CM | POA: Diagnosis not present

## 2021-05-24 DIAGNOSIS — Z13220 Encounter for screening for lipoid disorders: Secondary | ICD-10-CM | POA: Diagnosis not present

## 2021-05-24 DIAGNOSIS — I493 Ventricular premature depolarization: Secondary | ICD-10-CM | POA: Diagnosis not present

## 2021-05-24 DIAGNOSIS — R011 Cardiac murmur, unspecified: Secondary | ICD-10-CM | POA: Diagnosis not present

## 2021-05-24 DIAGNOSIS — R002 Palpitations: Secondary | ICD-10-CM | POA: Diagnosis not present

## 2021-05-24 DIAGNOSIS — Z136 Encounter for screening for cardiovascular disorders: Secondary | ICD-10-CM | POA: Diagnosis not present

## 2021-05-24 DIAGNOSIS — I10 Essential (primary) hypertension: Secondary | ICD-10-CM | POA: Diagnosis not present

## 2021-06-23 DIAGNOSIS — Z01419 Encounter for gynecological examination (general) (routine) without abnormal findings: Secondary | ICD-10-CM | POA: Diagnosis not present

## 2021-06-23 DIAGNOSIS — Z6828 Body mass index (BMI) 28.0-28.9, adult: Secondary | ICD-10-CM | POA: Diagnosis not present

## 2021-08-04 NOTE — Progress Notes (Signed)
Cardiology Office Note:    Date:  08/06/2021   ID:  Karina Morris, DOB 01/28/1946, MRN [id number]  PCP:  [facility] [place]  Cardiologist:  None   Referring MD: Ramiro Harvest, PA-C   Chief Complaint  Patient presents with   Advice Only    Palpitations PVCs Anxiety concerning her longevity     History of Present Illness:    Karina Morris is a 75 y.o. female with a hx of primary hypertension, being referred for evaluation of palpitations and systolic murmur.  Since June she has been experiencing episodes of palpitations she can feel in her chest and up into her neck.  At times it can be quite frequent.  It is not exertion related.  It tends to occur when she is sitting and resting.  She denies orthopnea, PND, syncope, lower extremity swelling, and claudication.  She does not smoke.  She does not drink.  She has 2 cups of coffee per day.  She is taking no significant supplements.  Blocker therapy was recommended after she was noted by primary care to have frequent PVCs.  She was on 25 mg twice daily of metoprolol tartrate.  3 days ago she discontinued the medication so we can see her in the natural state.  Past Medical History:  Diagnosis Date   Anxiety    Anxiety    Arthritis    neck   Hypertension    Osteopenia    Symptomatic PVCs    Systolic murmur    Vitamin D deficiency     Past Surgical History:  Procedure Laterality Date   AUGMENTATION MAMMAPLASTY Bilateral    bilateral breast reduction  2001   CHOLECYSTECTOMY N/A 11/12/2018   Procedure: LAPAROSCOPIC CHOLECYSTECTOMY WITH POSSIBLE INTRAOPERATIVE CHOLANGIOGRAM;  Surgeon: Jovita Kussmaul, MD;  Location: [facility];  Service: General;  Laterality: N/A;   FACIAL COSMETIC SURGERY  2001   PLACEMENT OF BREAST IMPLANTS      Current Medications: Current Meds  Medication Sig   acetaminophen (TYLENOL) 325 MG tablet Take 2 tablets (650 mg total) by mouth every 6 (six) hours as needed for mild pain (or  temp > 100).   aspirin 81 MG chewable tablet Chew 81 mg by mouth daily.   Cholecalciferol (VITAMIN D) 2000 UNITS CAPS Take 1 capsule by mouth daily.    estrogens, conjugated, (PREMARIN) 0.3 MG tablet Take 0.3 mg by mouth daily. Take daily for 21 days then do not take for 7 days.   ibuprofen (ADVIL,MOTRIN) 200 MG tablet You can take 2-3 tablets every 6 hours as needed for pain, this is your second medicine for pain relief. You can buy this over the counter at any drug store.   LORazepam (ATIVAN) 1 MG tablet Take 0.5 mg by mouth as needed.   metoprolol tartrate (LOPRESSOR) 25 MG tablet Take 25 mg by mouth 2 (two) times daily.   Omega-3 Fatty Acids (OMEGA 3 PO) Take 1 capsule by mouth daily.   progesterone (PROMETRIUM) 100 MG capsule Take 100 mg by mouth at bedtime.   zolpidem (AMBIEN) 10 MG tablet Take 10 mg by mouth at bedtime as needed for sleep.     Allergies:   Patient has no known allergies.   Social History   Socioeconomic History   Marital status: Married    Spouse name: Not on file   Number of children: Not on file   Years of education: Not on file   Highest education level: Not  on file  Occupational History   Not on file  Tobacco Use   Smoking status: Never   Smokeless tobacco: Never  Substance and Sexual Activity   Alcohol use: Yes    Alcohol/week: 1.0 standard drink    Types: 1 Glasses of wine per week   Drug use: No   Sexual activity: Not on file  Other Topics Concern   Not on file  Social History Narrative   Not on file   Social Determinants of Health   Financial Resource Strain: Not on file  Food Insecurity: Not on file  Transportation Needs: Not on file  Physical Activity: Not on file  Stress: Not on file  Social Connections: Not on file     Family History: The patient's family history is negative for Colon cancer, Stomach cancer, and Breast cancer.  ROS:   Please see the history of present illness.    She admits to anxiety disorder.  She is currently  worried that she may have a life-threatening heart problem.  She has bilateral hip and leg discomfort that is being worked up.  There is a diagnosis of osteoarthritis.  All other systems reviewed and are negative.  EKGs/Labs/Other Studies Reviewed:    The following studies were reviewed today: No cardiac data available  EKG:  EKG 11/12/18 with LAA and nonspecific ST abnormality.  EKG today demonstrates biatrial abnormality, PVCs, otherwise unremarkable and not significantly different than January 2020  Recent Labs: No results found for requested labs within last 8760 hours.  Recent Lipid Panel No results found for: CHOL, TRIG, HDL, CHOLHDL, VLDL, LDLCALC, LDLDIRECT  Physical Exam:    VS:  BP (!) 150/80   Pulse 98   Ht 5' 3.5" (1.613 m)   Wt 166 lb 3.2 oz (75.4 kg)   SpO2 97%   BMI 28.98 kg/m     Wt Readings from Last 3 Encounters:  08/06/21 166 lb 3.2 oz (75.4 kg)  11/12/18 152 lb (68.9 kg)  11/23/12 160 lb (72.6 kg)     GEN: Anxious appearing. No acute distress HEENT: Normal NECK: No JVD. LYMPHATICS: No lymphadenopathy CARDIAC: 3/6 right upper sternal left upper sternal left lower sternal systolic murmur of aortic stenosis or LV outflow obstruction.  RRR no gallop, or edema. VASCULAR:  Normal Pulses. No bruits. RESPIRATORY:  Clear to auscultation without rales, wheezing or rhonchi  ABDOMEN: Soft, non-tender, non-distended, No pulsatile mass, MUSCULOSKELETAL: No deformity  SKIN: Warm and dry NEUROLOGIC:  Alert and oriented x 3 PSYCHIATRIC:  Normal affect   ASSESSMENT:    1. Palpitations   2. Systolic murmur   3. PVC's (premature ventricular contractions)    PLAN:    In order of problems listed above:  2D Doppler echocardiogram to exclude aortic stenosis and hypertrophic obstructive cardiomyopathy.  Likely related to the PVCs that are being noted. I believe her palpitations are related to frequent PVCs.  Rule out atrial fibrillation.  2-week monitor will be  done. Noted on today's EKG.  The 2-week monitor will help [place] identify burden and help guide therapy.   Medication Adjustments/Labs and Tests Ordered: Current medicines are reviewed at length with the patient today.  Concerns regarding medicines are outlined above.  Orders Placed This Encounter  Procedures   LONG TERM MONITOR (3-14 DAYS)   EKG 12-Lead   ECHOCARDIOGRAM COMPLETE    No orders of the defined types were placed in this encounter.      Signed, Sinclair Grooms, MD  08/06/2021 10:28  AM    [place]

## 2021-08-06 ENCOUNTER — Encounter: Payer: Self-pay | Admitting: Interventional Cardiology

## 2021-08-06 ENCOUNTER — Other Ambulatory Visit: Payer: Self-pay | Admitting: Obstetrics & Gynecology

## 2021-08-06 ENCOUNTER — Ambulatory Visit (INDEPENDENT_AMBULATORY_CARE_PROVIDER_SITE_OTHER): Payer: Medicare Other | Admitting: Interventional Cardiology

## 2021-08-06 ENCOUNTER — Ambulatory Visit (INDEPENDENT_AMBULATORY_CARE_PROVIDER_SITE_OTHER): Payer: Medicare Other

## 2021-08-06 ENCOUNTER — Other Ambulatory Visit: Payer: Self-pay

## 2021-08-06 VITALS — BP 150/80 | HR 98 | Ht 63.5 in | Wt 166.2 lb

## 2021-08-06 DIAGNOSIS — I493 Ventricular premature depolarization: Secondary | ICD-10-CM

## 2021-08-06 DIAGNOSIS — R002 Palpitations: Secondary | ICD-10-CM

## 2021-08-06 DIAGNOSIS — Z1231 Encounter for screening mammogram for malignant neoplasm of breast: Secondary | ICD-10-CM

## 2021-08-06 DIAGNOSIS — R011 Cardiac murmur, unspecified: Secondary | ICD-10-CM

## 2021-08-06 NOTE — Patient Instructions (Addendum)
Medication Instructions:  Your physician recommends that you continue on your current medications as directed. Please refer to the Current Medication list given to you today.  *If you need a refill on your cardiac medications before your next appointment, please call your pharmacy*   Lab Work: None If you have labs (blood work) drawn today and your tests are completely normal, you will receive your results only by: [place] (if you have MyChart) OR A paper copy in the mail If you have any lab test that is abnormal or we need to change your treatment, we will call you to review the results.   Testing/Procedures: Your physician has requested that you have an echocardiogram. Echocardiography is a painless test that uses sound waves to create images of your heart. It provides your doctor with information about the size and shape of your heart and how well your heart's chambers and valves are working. This procedure takes approximately one hour. There are no restrictions for this procedure.  Your physician recommends that you wear a monitor for 2 weeks.   Follow-Up: At [facility], you and your health needs are our priority.  As part of our continuing mission to provide you with exceptional heart care, we have created designated Provider Care Teams.  These Care Teams include your primary Cardiologist (physician) and Advanced Practice Providers (APPs -  Physician Assistants and Nurse Practitioners) who all work together to provide you with the care you need, when you need it.  We recommend signing up for the patient portal called "MyChart".  Sign up information is provided on this After Visit Summary.  MyChart is used to connect with patients for Virtual Visits (Telemedicine).  Patients are able to view lab/test results, encounter notes, upcoming appointments, etc.  Non-urgent messages can be sent to your provider as well.   To learn more about what you can do with MyChart, go to  [email or web address].    Your next appointment:   4-6 week(s)- can have 11/28 at 9am or 11/29 at 1:20pm  The format for your next appointment:   In Person  Provider:   You may see Daneen Schick, III, MD or one of the following Advanced Practice Providers on your designated Care Team:   Cecilie Kicks, NP   Other Instructions   [place] Monitor Instructions  Your physician has requested you wear a ZIO patch monitor for 14 days.  This is a single patch monitor. [employer] supplies one patch monitor per enrollment. Additional stickers are not available. Please do not apply patch if you will be having a Nuclear Stress Test,  Echocardiogram, Cardiac CT, MRI, or Chest Xray during the period you would be wearing the  monitor. The patch cannot be worn during these tests. You cannot remove and re-apply the  ZIO XT patch monitor.  Your ZIO patch monitor will be mailed 3 day USPS to your address on file. It may take 3-5 days  to receive your monitor after you have been enrolled.  Once you have received your monitor, please review the enclosed instructions. Your monitor  has already been registered assigning a specific monitor serial # to you.  Billing and Patient Assistance Program Information  We have supplied [employer] with any of your insurance information on file for billing purposes. [employer] offers a sliding scale Patient Assistance Program for patients that do not have  insurance, or whose insurance does not completely cover the cost of the ZIO monitor.  You must apply for the  Patient Assistance Program to qualify for this discounted rate.  To apply, please call [employer] at [phone], select option 4, select option 2, ask to apply for  Patient Assistance Program. Theodore Demark will ask your household income, and how many people  are in your household. They will quote your out-of-pocket cost based on that information.  [employer] will also be able to set up a 89-month, interest-free  payment plan if needed.  Applying the monitor   Shave hair from upper left chest.  Hold abrader disc by orange tab. Rub abrader in 40 strokes over the upper left chest as  indicated in your monitor instructions.  Clean area with 4 enclosed alcohol pads. Let dry.  Apply patch as indicated in monitor instructions. Patch will be placed under collarbone on left  side of chest with arrow pointing upward.  Rub patch adhesive wings for 2 minutes. Remove white label marked "1". Remove the white  label marked "2". Rub patch adhesive wings for 2 additional minutes.  While looking in a mirror, press and release button in center of patch. A small green light will  flash 3-4 times. This will be your only indicator that the monitor has been turned on.  Do not shower for the first 24 hours. You may shower after the first 24 hours.  Press the button if you feel a symptom. You will hear a small click. Record Date, Time and  Symptom in the Patient Logbook.  When you are ready to remove the patch, follow instructions on the last 2 pages of Patient  Logbook. Stick patch monitor onto the last page of Patient Logbook.  Place Patient Logbook in the blue and white box. Use locking tab on box and tape box closed  securely. The blue and white box has prepaid postage on it. Please place it in the mailbox as  soon as possible. Your physician should have your test results approximately 7 days after the  monitor has been mailed back to [facility].  Call [place] at [phone] if you have questions regarding  your ZIO XT patch monitor. Call them immediately if you see an orange light blinking on your  monitor.  If your monitor falls off in less than 4 days, contact our Monitor department at [phone].  If your monitor becomes loose or falls off after 4 days call [employer] at [phone] for  suggestions on securing your monitor

## 2021-08-06 NOTE — Progress Notes (Unsigned)
Enrolled patient for a 14 day Zio XT Monitor to be mailed to patients home  

## 2021-08-16 ENCOUNTER — Ambulatory Visit (HOSPITAL_COMMUNITY): Payer: Medicare Other

## 2021-08-16 DIAGNOSIS — I493 Ventricular premature depolarization: Secondary | ICD-10-CM

## 2021-08-24 ENCOUNTER — Ambulatory Visit (HOSPITAL_COMMUNITY): Payer: Medicare Other | Attending: Cardiology

## 2021-08-24 ENCOUNTER — Other Ambulatory Visit: Payer: Self-pay

## 2021-08-24 DIAGNOSIS — R011 Cardiac murmur, unspecified: Secondary | ICD-10-CM

## 2021-08-24 LAB — ECHOCARDIOGRAM COMPLETE
AR max vel: 1.37 cm2
AV Area VTI: 1.36 cm2
AV Area mean vel: 1.33 cm2
AV Mean grad: 14 mmHg
AV Peak grad: 18.7 mmHg
Ao pk vel: 2.17 m/s
Area-P 1/2: 3.51 cm2
MV VTI: 1.46 cm2
S' Lateral: 1.8 cm

## 2021-08-25 ENCOUNTER — Telehealth: Payer: Self-pay | Admitting: Interventional Cardiology

## 2021-08-25 NOTE — Telephone Encounter (Signed)
Informed pt of results. Pt verbalized understanding. 

## 2021-08-25 NOTE — Telephone Encounter (Signed)
Patient is returning call to discuss echo results. °

## 2021-09-07 DIAGNOSIS — I493 Ventricular premature depolarization: Secondary | ICD-10-CM | POA: Diagnosis not present

## 2021-09-08 ENCOUNTER — Ambulatory Visit
Admission: RE | Admit: 2021-09-08 | Discharge: 2021-09-08 | Disposition: A | Discharge status: Home / Self Care | Payer: Medicare Other | Source: Ambulatory Visit | Attending: Obstetrics & Gynecology | Admitting: Obstetrics & Gynecology

## 2021-09-08 ENCOUNTER — Other Ambulatory Visit: Payer: Self-pay

## 2021-09-08 DIAGNOSIS — Z1231 Encounter for screening mammogram for malignant neoplasm of breast: Secondary | ICD-10-CM

## 2021-09-15 NOTE — Progress Notes (Signed)
Cardiology Office Note:    Date:  09/21/2021   ID:  Karina Morris, DOB 06/26/1946, MRN [id number]  PCP:  [facility], [place]  Cardiologist:  Sinclair Grooms, MD   Referring MD: [facility]*   Chief Complaint  Patient presents with   Follow-up    Premature ventricular contractions   Hypertension     History of Present Illness:    Karina Morris is a 75 y.o. female with a hx of frequent PVC's with 9% burden noted on monitor, palpitations made worse with metoprolol, moderate aortic stenosis, and primary hypertension.  Continues to have frequent palpitations.  There was a correlation between palpitations and PVCs during the prolonged monitor.  The patient initially felt that beta-blocker therapy was not helping.  She discontinued the therapy prior to the monitor.  She has started beta-blocker therapy back and now states that she feels as been a dramatic improvement in the way she was feeling during monitoring.  Palpitations are markedly improved.  She resumed metoprolol tartrate 25 mg twice daily.  She has been on this therapy now for 2 weeks.  She does not feel fatigued and sluggish as she did initially.  Past Medical History:  Diagnosis Date   Anxiety    Anxiety    Arthritis    neck   Hypertension    Osteopenia    Symptomatic PVCs    Systolic murmur    Vitamin D deficiency     Past Surgical History:  Procedure Laterality Date   AUGMENTATION MAMMAPLASTY Bilateral    bilateral breast reduction  2001   CHOLECYSTECTOMY N/A 11/12/2018   Procedure: LAPAROSCOPIC CHOLECYSTECTOMY WITH POSSIBLE INTRAOPERATIVE CHOLANGIOGRAM;  Surgeon: Jovita Kussmaul, MD;  Location: [facility];  Service: General;  Laterality: N/A;   FACIAL COSMETIC SURGERY  2001   PLACEMENT OF BREAST IMPLANTS      Current Medications: Current Meds  Medication Sig   acetaminophen (TYLENOL) 325 MG tablet Take 2 tablets (650 mg total) by mouth every 6 (six) hours as needed for mild pain (or  temp > 100).   aspirin 81 MG chewable tablet Chew 81 mg by mouth daily.   Cholecalciferol (VITAMIN D) 2000 UNITS CAPS Take 1 capsule by mouth daily.    estrogens, conjugated, (PREMARIN) 0.3 MG tablet Take 0.3 mg by mouth daily. Take daily for 21 days then do not take for 7 days.   ibuprofen (ADVIL,MOTRIN) 200 MG tablet You can take 2-3 tablets every 6 hours as needed for pain, this is your second medicine for pain relief. You can buy this over the counter at any drug store.   LORazepam (ATIVAN) 1 MG tablet Take 0.5 mg by mouth as needed.   metoprolol tartrate (LOPRESSOR) 25 MG tablet Take 1.5 tablets (37.5 mg total) by mouth 2 (two) times daily.   Misc Natural Products (OSTEO BI-FLEX ADV JOINT SHIELD) TABS Take by mouth daily.   Omega-3 Fatty Acids (OMEGA 3 PO) Take 1 capsule by mouth daily.   progesterone (PROMETRIUM) 100 MG capsule Take 100 mg by mouth at bedtime.   traMADol (ULTRAM) 50 MG tablet You can take one tablet every 6 hours as needed for pain not relieved by Tylenol(acetaminopnen) or ibuprofen.   zolpidem (AMBIEN) 10 MG tablet Take 10 mg by mouth at bedtime as needed for sleep.   [DISCONTINUED] metoprolol tartrate (LOPRESSOR) 25 MG tablet Take 25 mg by mouth 2 (two) times daily.     Allergies:   Patient  has no known allergies.   Social History   Socioeconomic History   Marital status: Married    Spouse name: Not on file   Number of children: Not on file   Years of education: Not on file   Highest education level: Not on file  Occupational History   Not on file  Tobacco Use   Smoking status: Never   Smokeless tobacco: Never  Substance and Sexual Activity   Alcohol use: Yes    Alcohol/week: 1.0 standard drink    Types: 1 Glasses of wine per week   Drug use: No   Sexual activity: Not on file  Other Topics Concern   Not on file  Social History Narrative   Not on file   Social Determinants of Health   Financial Resource Strain: Not on file  Food Insecurity: Not on  file  Transportation Needs: Not on file  Physical Activity: Not on file  Stress: Not on file  Social Connections: Not on file     Family History: The patient's family history is negative for Colon cancer, Stomach cancer, and Breast cancer.  ROS:   Please see the history of present illness.    2 cups of caffeinated coffee per day, anxiety concerning a lot of issues in her life, does not sleep well.  All other systems reviewed and are negative.  EKGs/Labs/Other Studies Reviewed:    The following studies were reviewed today: Continuous Monitor 08/2021: Study Highlights    Normal sinus rhythm Non sustained VT, 12 beat salvo Non sustaiend SVT withm 11 beat salvo being longest Frequent PVC's with 9.6% burden No symptoms reported     Patch Wear Time:  14 days and 0 hours (2022-10-24T13:23:22-0400 to 2022-11-07T12:23:26-0500)   Patient had a min HR of 59 bpm, max HR of 179 bpm, and avg HR of 84 bpm. Predominant underlying rhythm was Sinus Rhythm. 1 run of Ventricular Tachycardia occurred lasting 12 beats with a max rate of 143 bpm (avg 134 bpm). 3 Supraventricular Tachycardia  runs occurred, the run with the fastest interval lasting 11 beats with a max rate of 179 bpm (avg 166 bpm); the run with the fastest interval was also the longest. Isolated SVEs were rare (<1.0%), SVE Couplets were rare (<1.0%), and SVE Triplets were  rare (<1.0%). Isolated VEs were frequent (9.6%, [id number]), VE Couplets were rare (<1.0%, 169), and no VE Triplets were present. Ventricular Bigeminy and Trigeminy were present.    2D Doppler ECHOCARDIOGRAM: IMPRESSIONS     1. Left ventricular ejection fraction, by estimation, is 60 to 65%. The  left ventricle has normal function. The left ventricle has no regional  wall motion abnormalities. Left ventricular diastolic parameters are  consistent with Grade I diastolic  dysfunction (impaired relaxation).   2. Right ventricular systolic function is normal. The right  ventricular  size is normal. Tricuspid regurgitation signal is inadequate for assessing  PA pressure.   3. The mitral valve is normal in structure. Trivial mitral valve  regurgitation. No evidence of mitral stenosis. Moderate mitral annular  calcification.   4. The aortic valve is tricuspid. Aortic valve regurgitation is not  visualized. Mild to moderate aortic valve stenosis (mild by mean gradient,  moderate by calculated AVA). Aortic valve area, by VTI measures 1.36 cm.  Aortic valve mean gradient measures  14.0 mmHg.   5. The inferior vena cava is normal in size with greater than 50%  respiratory variability, suggesting right atrial pressure of 3 mmHg.   EKG:  EKG no tracing today  Recent Labs: No results found for requested labs within last 8760 hours.  Recent Lipid Panel No results found for: CHOL, TRIG, HDL, CHOLHDL, VLDL, LDLCALC, LDLDIRECT  Physical Exam:    VS:  BP 140/88   Pulse 77   Ht 5' 3.5" (1.613 m)   Wt 164 lb (74.4 kg)   SpO2 98%   BMI 28.60 kg/m     Wt Readings from Last 3 Encounters:  09/21/21 164 lb (74.4 kg)  08/06/21 166 lb 3.2 oz (75.4 kg)  11/12/18 152 lb (68.9 kg)     GEN: Normal in appearance. No acute distress HEENT: Normal NECK: No JVD. LYMPHATICS: No lymphadenopathy CARDIAC: 2/6 systolic right upper sternal and left mid sternal systolic murmur. RRR no gallop, or edema. VASCULAR:  Normal Pulses. No bruits. RESPIRATORY:  Clear to auscultation without rales, wheezing or rhonchi  ABDOMEN: Soft, non-tender, non-distended, No pulsatile mass, MUSCULOSKELETAL: No deformity  SKIN: Warm and dry NEUROLOGIC:  Alert and oriented x 3 PSYCHIATRIC:  Normal affect   ASSESSMENT:    1. Palpitations   2. PVC's (premature ventricular contractions)   3. Systolic murmur   4. Primary hypertension   5. Diastolic dysfunction    PLAN:    In order of problems listed above:  Seem to be related to PVCs as noted above.  The patient autonomously stopped  taking beta-blocker therapy prior to the echo and the long-term monitor which revealed 9% PVC burden.  She resumed metoprolol at 25 mg twice daily and feels that there was a noticeable reduction in palpitations and severity of episodes.  She also does not feel as fatigued that she felt on the medication initially.  She does drink 2 cups of caffeinated coffee per day.  She has a very anxious person.  She does not sleep well.  All of these could be factors in stoking the burden of atrial fibs.  We will continue metoprolol at 25 mg twice daily for an additional week and then increase the dose to 37.5 mg twice daily.  Before I see her back in 3 months she will need to have an exercise treadmill test with beta-blocker therapy being held for 12 to 24 hours prior to procedure to rule out CAD and also to exclude exercise-induced worsening of PVCs.  Magnesium and potassium are being obtained today. See above She has mild to moderate aortic stenosis Target blood pressure is going to be 130/80 mmHg.  Low-salt diet.  Hopefully beta-blocker therapy will control blood pressure. Noted on echocardiography.  If dyspnea becomes an issue, consider SGLT2 therapy.   Medication Adjustments/Labs and Tests Ordered: Current medicines are reviewed at length with the patient today.  Concerns regarding medicines are outlined above.  Orders Placed This Encounter  Procedures   Magnesium   Basic metabolic panel   Exercise Tolerance Test    Meds ordered this encounter  Medications   metoprolol tartrate (LOPRESSOR) 25 MG tablet    Sig: Take 1.5 tablets (37.5 mg total) by mouth 2 (two) times daily.    Dispense:  270 tablet    Refill:  3    Dose change        Signed, Sinclair Grooms, MD  09/21/2021 1:30 PM    [facility]

## 2021-09-21 ENCOUNTER — Ambulatory Visit (INDEPENDENT_AMBULATORY_CARE_PROVIDER_SITE_OTHER): Payer: Medicare Other | Admitting: Interventional Cardiology

## 2021-09-21 ENCOUNTER — Encounter: Payer: Self-pay | Admitting: Interventional Cardiology

## 2021-09-21 ENCOUNTER — Other Ambulatory Visit: Payer: Self-pay

## 2021-09-21 VITALS — BP 140/88 | HR 77 | Ht 63.5 in | Wt 164.0 lb

## 2021-09-21 DIAGNOSIS — R002 Palpitations: Secondary | ICD-10-CM

## 2021-09-21 DIAGNOSIS — I10 Essential (primary) hypertension: Secondary | ICD-10-CM

## 2021-09-21 DIAGNOSIS — R011 Cardiac murmur, unspecified: Secondary | ICD-10-CM | POA: Diagnosis not present

## 2021-09-21 DIAGNOSIS — I493 Ventricular premature depolarization: Secondary | ICD-10-CM | POA: Diagnosis not present

## 2021-09-21 DIAGNOSIS — I5189 Other ill-defined heart diseases: Secondary | ICD-10-CM

## 2021-09-21 MED ORDER — METOPROLOL TARTRATE 25 MG PO TABS: 37.5000 mg | ORAL_TABLET | Freq: Two times a day (BID) | ORAL | 3 refills | Status: DC

## 2021-09-21 NOTE — Patient Instructions (Signed)
Medication Instructions:  1) In one week, INCREASE Metoprolol Tartrate to 37.5mg  twice daily.  *If you need a refill on your cardiac medications before your next appointment, please call your pharmacy*   Lab Work: BMET and Magnesium today  If you have labs (blood work) drawn today and your tests are completely normal, you will receive your results only by: [place] (if you have MyChart) OR A paper copy in the mail If you have any lab test that is abnormal or we need to change your treatment, we will call you to review the results.   Testing/Procedures: Your physician has requested that you have an exercise tolerance test anytime before you see Dr. Tamala Julian back in 3-4 months. For further information please visit [email or web address]. Please also follow instruction sheet, as given.   Follow-Up: At [facility], you and your health needs are our priority.  As part of our continuing mission to provide you with exceptional heart care, we have created designated Provider Care Teams.  These Care Teams include your primary Cardiologist (physician) and Advanced Practice Providers (APPs -  Physician Assistants and Nurse Practitioners) who all work together to provide you with the care you need, when you need it.  We recommend signing up for the patient portal called "MyChart".  Sign up information is provided on this After Visit Summary.  MyChart is used to connect with patients for Virtual Visits (Telemedicine).  Patients are able to view lab/test results, encounter notes, upcoming appointments, etc.  Non-urgent messages can be sent to your provider as well.   To learn more about what you can do with MyChart, go to [email or web address].    Your next appointment:   3-4 month(s)  The format for your next appointment:   In Person  Provider:   Sinclair Grooms, MD     Other Instructions

## 2021-09-22 LAB — MAGNESIUM: Magnesium: 1.9 mg/dL (ref 1.6–2.3)

## 2021-09-22 LAB — BASIC METABOLIC PANEL
BUN/Creatinine Ratio: 18 (ref 12–28)
BUN: 16 mg/dL (ref 8–27)
CO2: 22 mmol/L (ref 20–29)
Calcium: 9.5 mg/dL (ref 8.7–10.3)
Chloride: 107 mmol/L — ABNORMAL HIGH (ref 96–106)
Creatinine, Ser: 0.88 mg/dL (ref 0.57–1.00)
Glucose: 94 mg/dL (ref 70–99)
Potassium: 4.1 mmol/L (ref 3.5–5.2)
Sodium: 144 mmol/L (ref 134–144)
eGFR: 68 mL/min/{1.73_m2} (ref >59)

## 2021-09-29 NOTE — Addendum Note (Signed)
Addended by: Belva Crome on: 09/29/2021 09:09 PM   Modules accepted: Orders

## 2021-09-29 NOTE — Addendum Note (Signed)
Addended by: Loren Racer on: 09/29/2021 09:23 AM   Modules accepted: Orders

## 2021-10-05 ENCOUNTER — Other Ambulatory Visit: Payer: Self-pay

## 2021-10-05 ENCOUNTER — Ambulatory Visit: Payer: Medicare Other

## 2021-10-07 ENCOUNTER — Ambulatory Visit (INDEPENDENT_AMBULATORY_CARE_PROVIDER_SITE_OTHER): Payer: Medicare Other

## 2021-10-07 ENCOUNTER — Other Ambulatory Visit: Payer: Self-pay

## 2021-10-07 DIAGNOSIS — I493 Ventricular premature depolarization: Secondary | ICD-10-CM

## 2021-10-07 LAB — EXERCISE TOLERANCE TEST
Angina Index: 0
Duke Treadmill Score: 4
Estimated workload: 5.8
Exercise duration (min): 4 min
Exercise duration (sec): 0 s
MPHR: 145 {beats}/min
Peak HR: 144 {beats}/min
Percent HR: 99 %
RPE: 16
Rest HR: 95 {beats}/min
ST Depression (mm): 0 mm

## 2022-01-09 NOTE — Progress Notes (Signed)
?Cardiology Office Note:   ? ?Date:  01/10/2022  ? ?ID:  Karina Morris, DOB Dec 01, 1945, MRN [id number] ? ?PCP:  [facility], [place]  ?Cardiologist:  Sinclair Grooms, MD  ? ?Referring MD: [facility]*  ? ?Chief Complaint  ?Patient presents with  ? Hospitalization Follow-up  ?  PVCs ?Aortic stenosis  ? ? ?History of Present Illness:   ? ?Karina Morris is a 76 y.o. female with a hx of frequent PVC's with 9% burden noted on monitor, palpitations made worse with metoprolol, moderate aortic stenosis, and primary hypertension. ? ?He is doing well.  She has anxiety about the possibility of having problems related to her heart.  She was having palpitations until beta-blocker therapy was started.  She had a PVC burden of 9%.  No symptoms are currently occurring. ? ?Past Medical History:  ?Diagnosis Date  ? Anxiety   ? Anxiety   ? Arthritis   ? neck  ? Hypertension   ? Osteopenia   ? Symptomatic PVCs   ? Systolic murmur   ? Vitamin D deficiency   ? ? ?Past Surgical History:  ?Procedure Laterality Date  ? AUGMENTATION MAMMAPLASTY Bilateral   ? bilateral breast reduction  2001  ? CHOLECYSTECTOMY N/A 11/12/2018  ? Procedure: LAPAROSCOPIC CHOLECYSTECTOMY WITH POSSIBLE INTRAOPERATIVE CHOLANGIOGRAM;  Surgeon: Jovita Kussmaul, MD;  Location: [facility];  Service: General;  Laterality: N/A;  ? FACIAL COSMETIC SURGERY  2001  ? PLACEMENT OF BREAST IMPLANTS    ? ? ?Current Medications: ?Current Meds  ?Medication Sig  ? acetaminophen (TYLENOL) 325 MG tablet Take 2 tablets (650 mg total) by mouth every 6 (six) hours as needed for mild pain (or temp > 100).  ? aspirin 81 MG chewable tablet Chew 81 mg by mouth daily.  ? Cholecalciferol (VITAMIN D) 2000 UNITS CAPS Take 1 capsule by mouth daily.   ? estrogens, conjugated, (PREMARIN) 0.3 MG tablet Take 0.3 mg by mouth daily. Take daily for 21 days then do not take for 7 days.  ? ibuprofen (ADVIL,MOTRIN) 200 MG tablet You can take 2-3 tablets every 6 hours as needed for  pain, this is your second medicine for pain relief. You can buy this over the counter at any drug store.  ? LORazepam (ATIVAN) 1 MG tablet Take 0.5 mg by mouth as needed.  ? metoprolol tartrate (LOPRESSOR) 25 MG tablet Take 1.5 tablets (37.5 mg total) by mouth 2 (two) times daily.  ? Misc Natural Products (OSTEO BI-FLEX ADV JOINT SHIELD) TABS Take by mouth daily.  ? Omega-3 Fatty Acids (OMEGA 3 PO) Take 1 capsule by mouth daily.  ? progesterone (PROMETRIUM) 100 MG capsule Take 100 mg by mouth at bedtime.  ? traMADol (ULTRAM) 50 MG tablet You can take one tablet every 6 hours as needed for pain not relieved by Tylenol(acetaminopnen) or ibuprofen.  ? zolpidem (AMBIEN) 10 MG tablet Take 10 mg by mouth at bedtime as needed for sleep. Pt takes 1/2 tablet as bedtime as needed.  ?  ? ?Allergies:   Patient has no known allergies.  ? ?Social History  ? ?Socioeconomic History  ? Marital status: Married  ?  Spouse name: Not on file  ? Number of children: Not on file  ? Years of education: Not on file  ? Highest education level: Not on file  ?Occupational History  ? Not on file  ?Tobacco Use  ? Smoking status: Never  ? Smokeless tobacco: Never  ?Substance  and Sexual Activity  ? Alcohol use: Yes  ?  Alcohol/week: 1.0 standard drink  ?  Types: 1 Glasses of wine per week  ? Drug use: No  ? Sexual activity: Not on file  ?Other Topics Concern  ? Not on file  ?Social History Narrative  ? Not on file  ? ?Social Determinants of Health  ? ?Financial Resource Strain: Not on file  ?Food Insecurity: Not on file  ?Transportation Needs: Not on file  ?Physical Activity: Not on file  ?Stress: Not on file  ?Social Connections: Not on file  ?  ? ?Family History: ?The patient's family history is negative for Colon cancer, Stomach cancer, and Breast cancer. ? ?ROS:   ?Please see the history of present illness.    ?Rare palpitation.  All other systems reviewed and are negative. ? ?EKGs/Labs/Other Studies Reviewed:   ? ?The following studies were  reviewed today: ?2D Doppler echocardiogram 08/24/2021: ?IMPRESSIONS  ? ? ? 1. Left ventricular ejection fraction, by estimation, is 60 to 65%. The  ?left ventricle has normal function. The left ventricle has no regional  ?wall motion abnormalities. Left ventricular diastolic parameters are  ?consistent with Grade I diastolic  ?dysfunction (impaired relaxation).  ? 2. Right ventricular systolic function is normal. The right ventricular  ?size is normal. Tricuspid regurgitation signal is inadequate for assessing  ?PA pressure.  ? 3. The mitral valve is normal in structure. Trivial mitral valve  ?regurgitation. No evidence of mitral stenosis. Moderate mitral annular  ?calcification.  ? 4. The aortic valve is tricuspid. Aortic valve regurgitation is not  ?visualized. Mild to moderate aortic valve stenosis (mild by mean gradient,  ?moderate by calculated AVA). Aortic valve area, by VTI measures 1.36 cm?Marland Kitchen  ?Aortic valve mean gradient measures  ?14.0 mmHg.  ? 5. The inferior vena cava is normal in size with greater than 50%  ?respiratory variability, suggesting right atrial pressure of 3 mmHg.  ? ? ?EKG:  EKG was not repeated. ? ?Recent Labs: ?09/21/2021: BUN 16; Creatinine, Ser 0.88; Magnesium 1.9; Potassium 4.1; Sodium 144  ?Recent Lipid Panel ?No results found for: CHOL, TRIG, HDL, CHOLHDL, VLDL, LDLCALC, LDLDIRECT ? ?Physical Exam:   ? ?VS:  BP 124/60   Pulse 74   Ht 5' 3.5" (1.613 m)   Wt 166 lb 6.4 oz (75.5 kg)   SpO2 97%   BMI 29.01 kg/m?    ? ?Wt Readings from Last 3 Encounters:  ?01/10/22 166 lb 6.4 oz (75.5 kg)  ?09/21/21 164 lb (74.4 kg)  ?08/06/21 166 lb 3.2 oz (75.4 kg)  ?  ? ?GEN: Slightly overweight without obesity.. No acute distress ?HEENT: Normal ?NECK: No JVD. ?LYMPHATICS: No lymphadenopathy ?CARDIAC: 2/6 systolic crescendo decrescendo right upper sternal murmur. RRR no gallop, or edema. ?VASCULAR:  Normal Pulses. No bruits. ?RESPIRATORY:  Clear to auscultation without rales, wheezing or rhonchi   ?ABDOMEN: Soft, non-tender, non-distended, No pulsatile mass, ?MUSCULOSKELETAL: No deformity  ?SKIN: Warm and dry ?NEUROLOGIC:  Alert and oriented x 3 ?PSYCHIATRIC:  Normal affect  ? ?ASSESSMENT:   ? ?1. Diastolic dysfunction   ?2. PVC's (premature ventricular contractions)   ?3. Systolic murmur   ?4. Primary hypertension   ?5. Palpitations   ? ?PLAN:   ? ?In order of problems listed above: ? ?No dyspnea to suggest diastolic heart failure.  No volume overload on exam ?On auscultation today she is in quadrigeminy with every fourth beat being premature.  I assume ventricular.  Asymptomatic relative to palpitations. ?Mild to  moderate aortic stenosis.  Repeat echocardiogram in 6 to 9 months. ?Blood pressure is much improved with increased dose of metoprolol. ? ?Clinical follow-up in 6 to 9 months after echocardiogram is done to follow-up the aortic stenosis. ? ? ?Medication Adjustments/Labs and Tests Ordered: ?Current medicines are reviewed at length with the patient today.  Concerns regarding medicines are outlined above.  ?No orders of the defined types were placed in this encounter. ? ?No orders of the defined types were placed in this encounter. ? ? ?There are no Patient Instructions on file for this visit.  ? ?Signed, ?Sinclair Grooms, MD  ?01/10/2022 2:13 PM    ?[place] ?

## 2022-01-10 ENCOUNTER — Encounter: Payer: Self-pay | Admitting: Interventional Cardiology

## 2022-01-10 ENCOUNTER — Other Ambulatory Visit: Payer: Self-pay

## 2022-01-10 ENCOUNTER — Ambulatory Visit (INDEPENDENT_AMBULATORY_CARE_PROVIDER_SITE_OTHER): Payer: Medicare Other | Admitting: Interventional Cardiology

## 2022-01-10 VITALS — BP 124/60 | HR 74 | Ht 63.5 in | Wt 166.4 lb

## 2022-01-10 DIAGNOSIS — I350 Nonrheumatic aortic (valve) stenosis: Secondary | ICD-10-CM | POA: Diagnosis not present

## 2022-01-10 DIAGNOSIS — I493 Ventricular premature depolarization: Secondary | ICD-10-CM | POA: Diagnosis not present

## 2022-01-10 DIAGNOSIS — R002 Palpitations: Secondary | ICD-10-CM | POA: Diagnosis not present

## 2022-01-10 DIAGNOSIS — I5189 Other ill-defined heart diseases: Secondary | ICD-10-CM

## 2022-01-10 DIAGNOSIS — R011 Cardiac murmur, unspecified: Secondary | ICD-10-CM | POA: Diagnosis not present

## 2022-01-10 DIAGNOSIS — I10 Essential (primary) hypertension: Secondary | ICD-10-CM | POA: Diagnosis not present

## 2022-01-10 NOTE — Patient Instructions (Addendum)
Medication Instructions:  ?Your physician recommends that you continue on your current medications as directed. Please refer to the Current Medication list given to you today. ? ?*If you need a refill on your cardiac medications before your next appointment, please call your pharmacy* ? ? ?Lab Work: ?None ?If you have labs (blood work) drawn today and your tests are completely normal, you will receive your results only by: ?MyChart Message (if you have MyChart) OR ?A paper copy in the mail ?If you have any lab test that is abnormal or we need to change your treatment, we will call you to review the results. ? ? ?Testing/Procedures: ?Your physician has requested that you have an echocardiogram mid to late November. Echocardiography is a painless test that uses sound waves to create images of your heart. It provides your doctor with information about the size and shape of your heart and how well your heart?s chambers and valves are working. This procedure takes approximately one hour. There are no restrictions for this procedure. ? ? ?Follow-Up: ?At [facility], you and your health needs are our priority.  As part of our continuing mission to provide you with exceptional heart care, we have created designated Provider Care Teams.  These Care Teams include your primary Cardiologist (physician) and Advanced Practice Providers (APPs -  Physician Assistants and Nurse Practitioners) who all work together to provide you with the care you need, when you need it. ? ?We recommend signing up for the patient portal called "MyChart".  Sign up information is provided on this After Visit Summary.  MyChart is used to connect with patients for Virtual Visits (Telemedicine).  Patients are able to view lab/test results, encounter notes, upcoming appointments, etc.  Non-urgent messages can be sent to your provider as well.   ?To learn more about what you can do with MyChart, go to [email or web address].   ? ?Your next appointment:    ?2-4 week(s) after echo ? ?The format for your next appointment:   ?In Person ? ?Provider:   ?Sinclair Grooms, MD  ? ? ?Other Instructions ?  ?

## 2022-02-18 ENCOUNTER — Other Ambulatory Visit: Payer: Self-pay

## 2022-02-18 MED ORDER — METOPROLOL TARTRATE 25 MG PO TABS: 37.5000 mg | ORAL_TABLET | Freq: Two times a day (BID) | ORAL | 3 refills | Status: DC

## 2022-06-13 DIAGNOSIS — N959 Unspecified menopausal and perimenopausal disorder: Secondary | ICD-10-CM | POA: Diagnosis not present

## 2022-06-13 DIAGNOSIS — F419 Anxiety disorder, unspecified: Secondary | ICD-10-CM | POA: Diagnosis not present

## 2022-06-13 DIAGNOSIS — Z79890 Hormone replacement therapy (postmenopausal): Secondary | ICD-10-CM | POA: Diagnosis not present

## 2022-06-14 IMAGING — MG DIGITAL SCREENING BREAST BILAT IMPLANT W/ TOMO W/ CAD
8 of 12 series · 8 of 28 positions shown · non-contrast
Comparison: Previous exam(s).

CLINICAL DATA: Screening.

EXAM:
DIGITAL SCREENING BILATERAL MAMMOGRAM WITH IMPLANTS, CAD AND TOMO
The patient has retroglandular implants. Standard and implant
displaced views were performed.

[R CC]
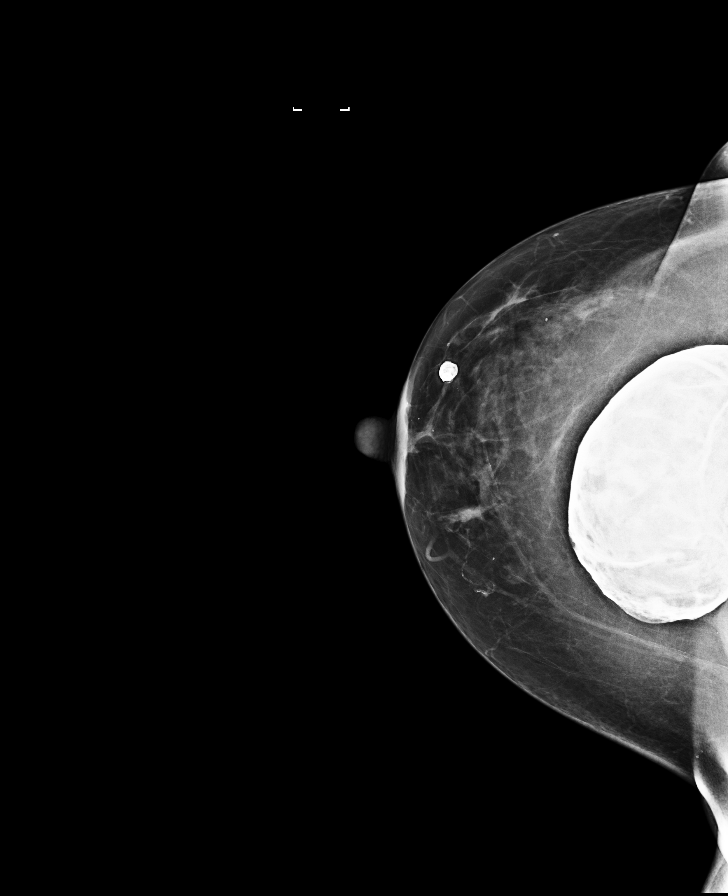

[L MLO]
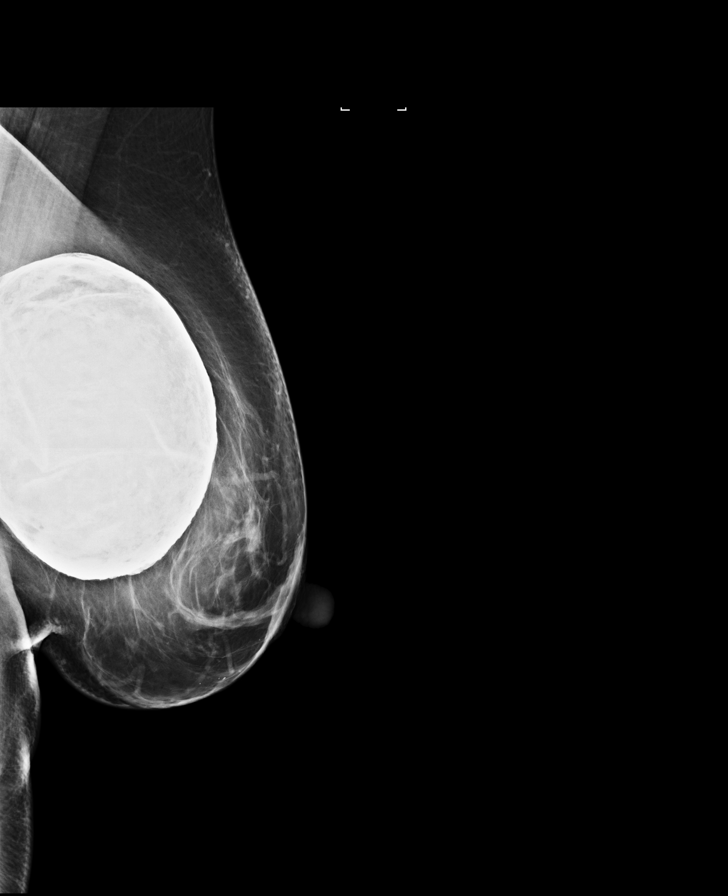

[R MLO]
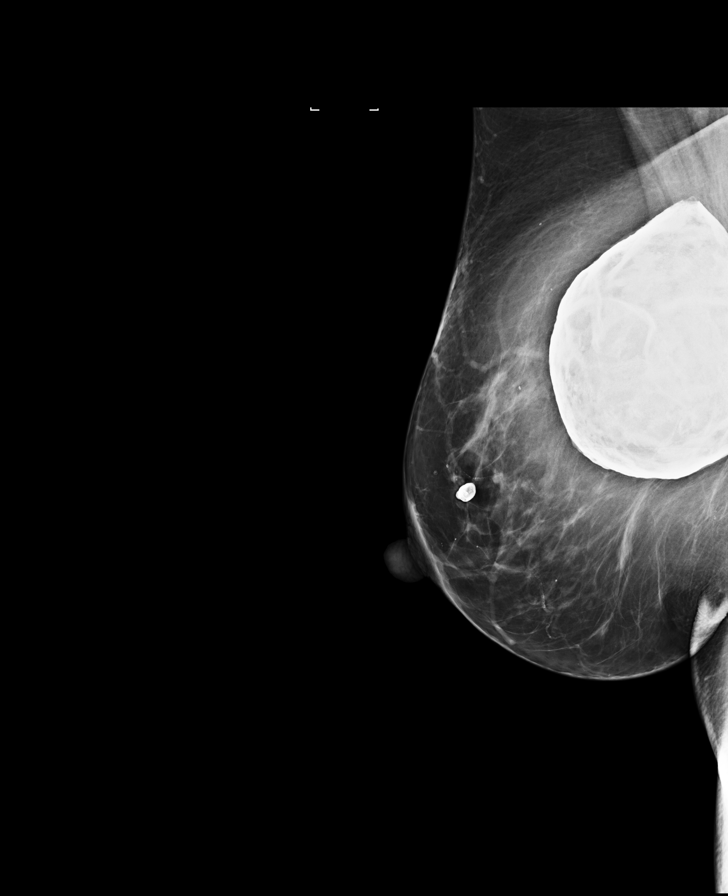

[L CC]
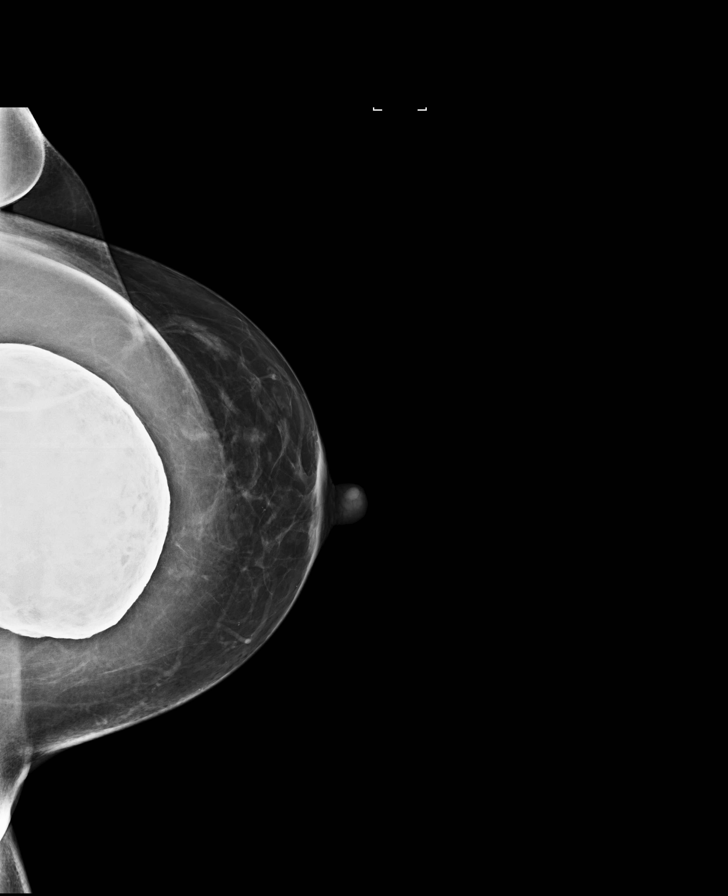

[L MLO synth-2D]
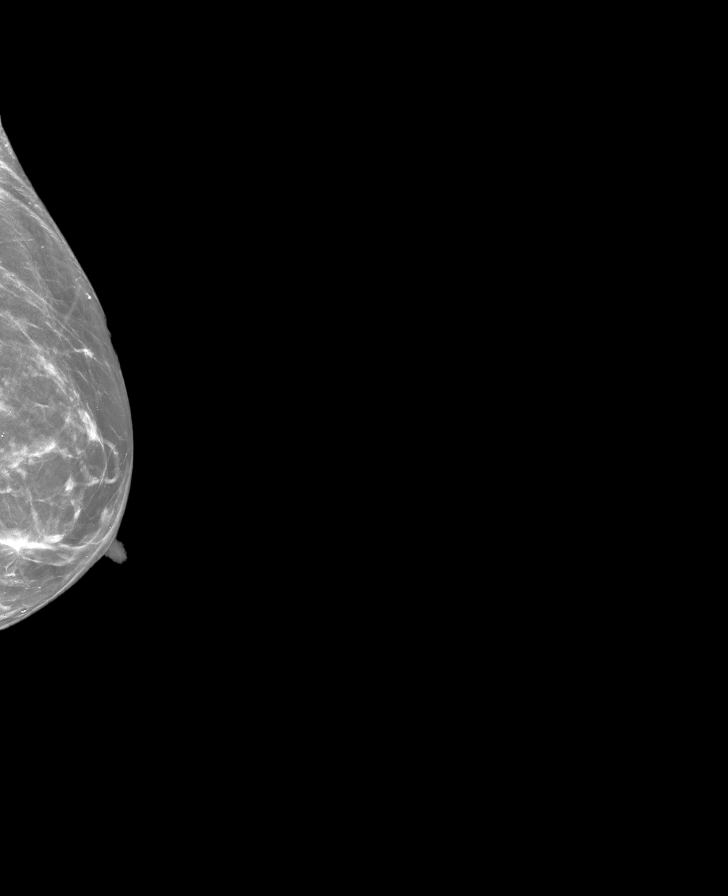

[L CC synth-2D]
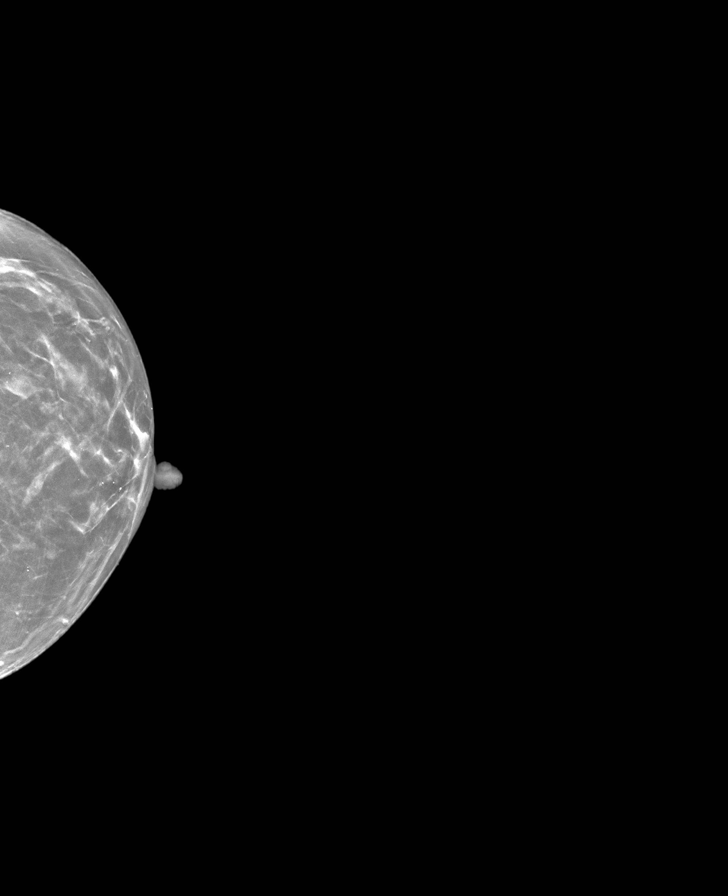

[R MLO synth-2D]
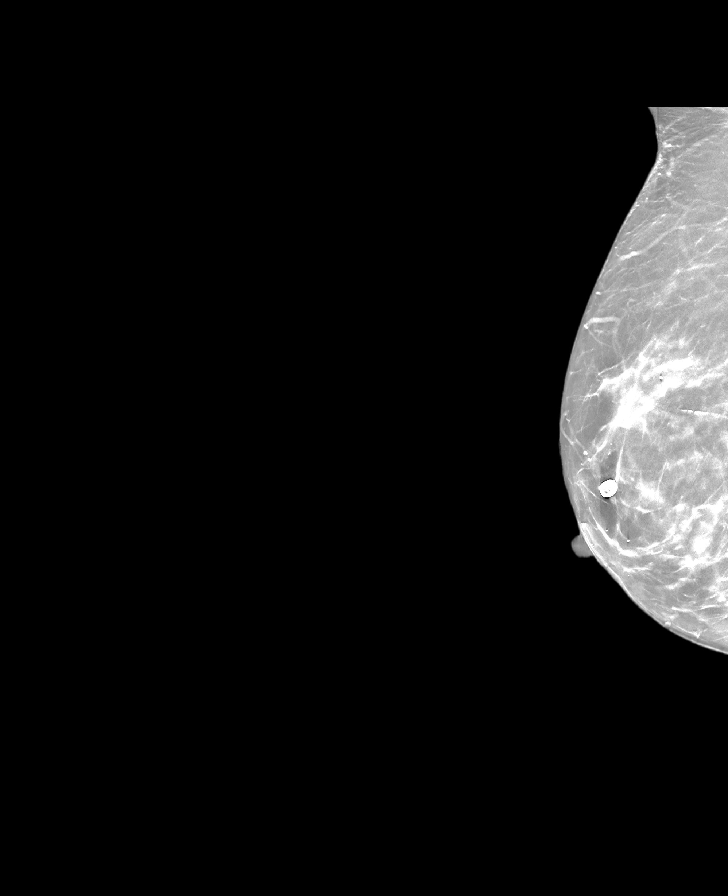

[R CC synth-2D]
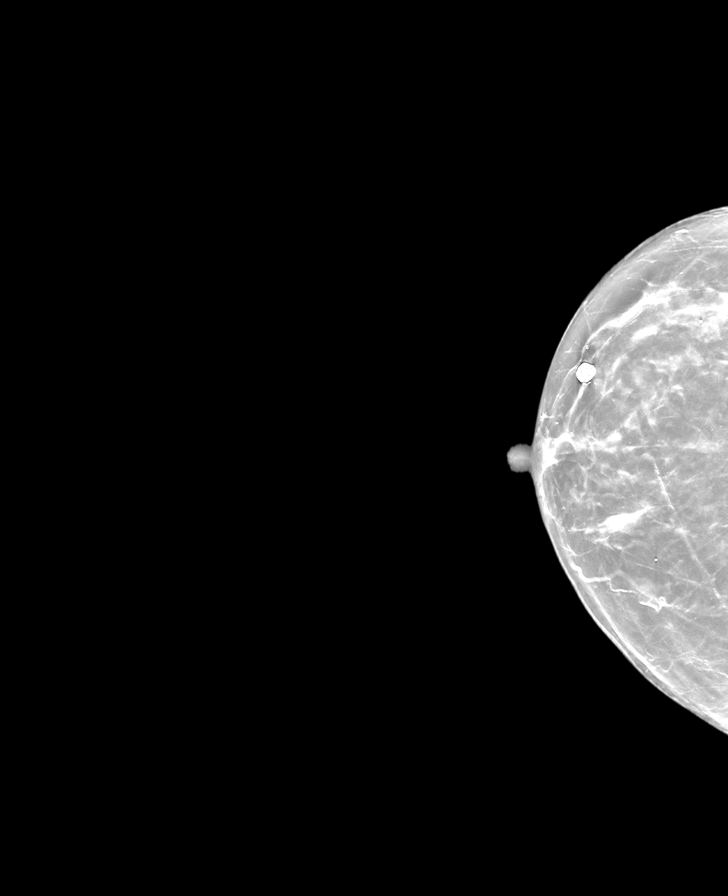

[8 of 28 positions shown; findings below may reference images not displayed]

ACR Breast Density Category b: There are scattered areas of
fibroglandular density.
FINDINGS: There are no findings suspicious for malignancy. Images were
processed with CAD.
IMPRESSION: No mammographic evidence of malignancy. A result letter of this
screening mammogram will be mailed directly to the patient.

RECOMMENDATION:
Screening mammogram in one year. (Code:VZ-A-PDF)

BI-RADS CATEGORY  1:  Negative.

## 2022-08-03 DIAGNOSIS — Z23 Encounter for immunization: Secondary | ICD-10-CM | POA: Diagnosis not present

## 2022-08-15 ENCOUNTER — Other Ambulatory Visit: Payer: Self-pay | Admitting: Obstetrics and Gynecology

## 2022-08-15 DIAGNOSIS — Z1231 Encounter for screening mammogram for malignant neoplasm of breast: Secondary | ICD-10-CM

## 2022-09-01 ENCOUNTER — Ambulatory Visit (HOSPITAL_COMMUNITY): Payer: Medicare Other | Attending: Interventional Cardiology

## 2022-09-01 DIAGNOSIS — I350 Nonrheumatic aortic (valve) stenosis: Secondary | ICD-10-CM | POA: Insufficient documentation

## 2022-09-01 LAB — ECHOCARDIOGRAM COMPLETE
AR max vel: 1.25 cm2
AV Area VTI: 1.2 cm2
AV Area mean vel: 1.27 cm2
AV Mean grad: 10 mmHg
AV Peak grad: 18.1 mmHg
Ao pk vel: 2.13 m/s
Area-P 1/2: 4.1 cm2
S' Lateral: 1.8 cm

## 2022-10-06 ENCOUNTER — Ambulatory Visit
Admission: RE | Admit: 2022-10-06 | Discharge: 2022-10-06 | Disposition: A | Discharge status: Home / Self Care | Payer: Medicare Other | Source: Ambulatory Visit | Attending: Obstetrics and Gynecology | Admitting: Obstetrics and Gynecology

## 2022-10-06 DIAGNOSIS — Z1231 Encounter for screening mammogram for malignant neoplasm of breast: Secondary | ICD-10-CM

## 2022-12-22 ENCOUNTER — Encounter: Payer: Self-pay | Admitting: Internal Medicine

## 2022-12-26 NOTE — Progress Notes (Signed)
Cardiology Office Note:    Date:  12/27/2022   ID:  Karina Morris, DOB 1946/10/08, MRN [id number]  PCP:  [facility] [place] [facility] Cardiologist:  Freada Bergeron, MD     Referring MD: [facility]*   Chief Complaint: hypertension  History of Present Illness:    Karina Morris is a very pleasant 77 y.o. female with a hx of frequent PVCs with a 9% burden noted on monitor, symptomatic palpitations, moderate aortic stenosis, primary hypertension, and diastolic dysfunction  Referred to cardiology for evaluation of palpitations and seen by Dr. Tamala Julian on 08/06/2021.  She was also noted to have a systolic murmur by PCP.  Beta-blocker therapy was recommended for frequent PVCs by PCP and she was started on metoprolol to tartrate 25 mg twice daily.  At office visit with Dr. Tamala Julian, she had discontinued metoprolol.  Drinks 2 cups of coffee daily, does not smoke or drink alcohol. History of anxiety about possibility of heart problems. Improvement in symptoms associated with palpitations on beta blocker therapy.  TTE 08/24/2021 revealed normal LVEF, mild aortic stenosis, G1 DD. 14 day Zio monitor revealed normal sinus rhythm underlying rhythm, NSVT 12 beats, nonsustained SVT with 11 beat salvo being longest, frequent PVCs with 9.6% burden.   Last cardiology clinic visit was 01/10/22 with Dr. Tamala Julian. On auscultation, noted ectopy on ausculatation with every fourth beat being premature. She was asymptomatic.  Recommendation to repeat echo in 6 to 9 months. TTE on 09/01/22 revealed hyperdynamic LV > 75%, mild LVH, G1DD, mild calcification of the aortic valve with no evidence of aortic valve stenosis.  Aortic valve mean gradient measures 10.0 mmHg.  Today, she is here alone for follow-up. Is caring for her husband s/p CABG and valve replacement, was in hospital for 5 weeks in January. He is type 1 diabetic. He is now home and has been graduated by OT and PT. feels  like she has been managing well, however recently has noted her BP has been elevated. Reports systolic readings between 128-165 mmHg. avoids high sodium diet.  Denies shortness of breath, orthopnea, edema, PND.  Prefers to sleep flat.  No lightheadedness, presyncope, syncope.  Has taken Xanax in the past for anxiety or if she felt her heart racing, "almost like a panic attack." Plans to gradually wean off her hormone replacement therapy.   Past Medical History:  Diagnosis Date   Anxiety    Anxiety    Arthritis    neck   Hypertension    Osteopenia    Symptomatic PVCs    Systolic murmur    Vitamin D deficiency     Past Surgical History:  Procedure Laterality Date   AUGMENTATION MAMMAPLASTY Bilateral    bilateral breast reduction  2001   CHOLECYSTECTOMY N/A 11/12/2018   Procedure: LAPAROSCOPIC CHOLECYSTECTOMY WITH POSSIBLE INTRAOPERATIVE CHOLANGIOGRAM;  Surgeon: Jovita Kussmaul, MD;  Location: [facility];  Service: General;  Laterality: N/A;   FACIAL COSMETIC SURGERY  2001   PLACEMENT OF BREAST IMPLANTS      Current Medications: Current Meds  Medication Sig   acetaminophen (TYLENOL) 325 MG tablet Take 2 tablets (650 mg total) by mouth every 6 (six) hours as needed for mild pain (or temp > 100).   aspirin 81 MG chewable tablet Chew 81 mg by mouth daily.   busPIRone (BUSPAR) 7.5 MG tablet Take 1 tablet (7.5 mg total) by mouth 2 (two) times daily.   Cholecalciferol (VITAMIN  D) 2000 UNITS CAPS Take 1 capsule by mouth daily.    estrogens, conjugated, (PREMARIN) 0.3 MG tablet Take 0.3 mg by mouth every other day.   ibuprofen (ADVIL,MOTRIN) 200 MG tablet You can take 2-3 tablets every 6 hours as needed for pain, this is your second medicine for pain relief. You can buy this over the counter at any drug store.   LORazepam (ATIVAN) 1 MG tablet Take 0.5 mg by mouth as needed.   losartan (COZAAR) 25 MG tablet Take 1 tablet (25 mg total) by mouth daily.   metoprolol tartrate (LOPRESSOR) 25 MG tablet  Take 1.5 tablets (37.5 mg total) by mouth 2 (two) times daily.   Misc Natural Products (OSTEO BI-FLEX ADV JOINT SHIELD) TABS Take by mouth daily.   Omega-3 Fatty Acids (OMEGA 3 PO) Take 1 capsule by mouth daily.   PREVIDENT 5000 BOOSTER PLUS 1.1 % PSTE Place onto teeth.   progesterone (PROMETRIUM) 100 MG capsule Take 100 mg by mouth every other day.   traMADol (ULTRAM) 50 MG tablet You can take one tablet every 6 hours as needed for pain not relieved by Tylenol(acetaminopnen) or ibuprofen.   zolpidem (AMBIEN) 10 MG tablet Take 10 mg by mouth at bedtime as needed for sleep. Pt takes 1/2 tablet as bedtime as needed.     Allergies:   Patient has no known allergies.   Social History   Socioeconomic History   Marital status: Married    Spouse name: Not on file   Number of children: Not on file   Years of education: Not on file   Highest education level: Not on file  Occupational History   Not on file  Tobacco Use   Smoking status: Never   Smokeless tobacco: Never  Substance and Sexual Activity   Alcohol use: Yes    Alcohol/week: 1.0 standard drink of alcohol    Types: 1 Glasses of wine per week   Drug use: No   Sexual activity: Not on file  Other Topics Concern   Not on file  Social History Narrative   Not on file   Social Determinants of Health   Financial Resource Strain: Not on file  Food Insecurity: Not on file  Transportation Needs: Not on file  Physical Activity: Not on file  Stress: Not on file  Social Connections: Not on file     Family History: The patient's family history is negative for Colon cancer, Stomach cancer, and Breast cancer.  ROS:   Please see the history of present illness.   + anxiety All other systems reviewed and are negative.  Labs/Other Studies Reviewed:    The following studies were reviewed today:  Echo 09/01/22 1. Left ventricular ejection fraction, by estimation, is >75%. The left  ventricle has hyperdynamic function. The left  ventricle has no regional  wall motion abnormalities. There is mild left ventricular hypertrophy.  Left ventricular diastolic parameters  are consistent with Grade I diastolic dysfunction (impaired relaxation).   2. Right ventricular systolic function is normal. The right ventricular  size is normal.   3. The mitral valve is normal in structure. No evidence of mitral valve  regurgitation. No evidence of mitral stenosis. Moderate mitral annular  calcification.   4. The aortic valve is calcified. There is mild calcification of the  aortic valve. Aortic valve regurgitation is trivial. Aortic valve  sclerosis is present, with no evidence of aortic valve stenosis. Aortic  valve mean gradient measures 10.0 mmHg. Aortic   valve Vmax  measures 2.13 m/s.   5. The inferior vena cava is normal in size with greater than 50%  respiratory variability, suggesting right atrial pressure of 3 mmHg.   Comparison(s): Prior images reviewed side by side. Prior mean AV gradient  was 14 mmHg.   ETT 10/07/21   No ST deviation was noted.   The patient walked for 4 minutes and 1 seconds of a standard Bruce protocol treadmill test.   She achieved a peak heart rate of 144 which is 99% predicted maximal heart rate.   At peak exercise there were no ST or T wave changes to suggest ischemia.  There was no QRS widening at peak exercise.   She had a hypertensive response to exercise.   This is interpreted as a negative stress test.  Cardiac Monitor 09/10/21 Normal sinus rhythm Non sustained VT, 12 beat salvo Non sustaiend SVT withm 11 beat salvo being longest Frequent PVC's with 9.6% burden No symptoms reported  Recent Labs: No results found for requested labs within last 365 days.  Recent Lipid Panel No results found for: "CHOL", "TRIG", "HDL", "CHOLHDL", "VLDL", "LDLCALC", "LDLDIRECT"   Risk Assessment/Calculations:         Physical Exam:    VS:  BP 138/68   Pulse 70   Ht 5' 3.5" (1.613 m)   Wt 164  lb 3.2 oz (74.5 kg)   SpO2 95%   BMI 28.63 kg/m     Wt Readings from Last 3 Encounters:  12/27/22 164 lb 3.2 oz (74.5 kg)  01/10/22 166 lb 6.4 oz (75.5 kg)  09/21/21 164 lb (74.4 kg)     GEN:  Well nourished, well developed in no acute distress HEENT: Normal NECK: No JVD; No carotid bruits CARDIAC: RRR, 3/6 systolic murmur. No rubs, gallops RESPIRATORY:  Clear to auscultation without rales, wheezing or rhonchi  ABDOMEN: Soft, non-tender, non-distended MUSCULOSKELETAL:  No edema; No deformity. 2+ pedal pulses, equal bilaterally SKIN: Warm and dry NEUROLOGIC:  Alert and oriented x 3 PSYCHIATRIC:  Normal affect   EKG:  EKG is ordered today.  The ekg ordered today demonstrates normal sinus rhythm at 70 bpm, no ST abnormality  Diagnoses:    1. Primary hypertension   2. PVC's (premature ventricular contractions)   3. Aortic valve stenosis, etiology of cardiac valve disease unspecified   4. Heart palpitations   5. Anxiety    Assessment and Plan:     Hypertension: BP is mildly elevated today. Home BP readings are mostly > 140/80.  Lengthy discussion regarding BP management generally requires more than one antihypertensive agent. Her metoprolol was increased at last office visit and BP remains elevated. May be situational with current life stress.  We will go ahead and start losartan 25 mg once daily. Advised her to continue to monitor home BP for goal <140/80.  Will get BMP in 2 weeks. Encouraged low sodium, heart healthy diet.   Aortic stenosis: Echo 09/01/2022 revealed mild calcification of the aortic valve with no evidence of aortic valve stenosis. Aortic valve with MG 10 mmHg, AV V-max 2.13 m/s. She is asymptomatic. Will continue to follow clinically at this time with repeat echo when clinically indicated.   Palpitations: Reports significant improvement in palpitations on higher dose of metoprolol.  They are currently quiescent. Continue metoprolol.   Anxiety: Situational anxiety  with husband's lengthy illness and recovery. Took Xanax and Ambien in the past.  I would like for her to try buspirone which has less side effects and less incidence  of dependence. Can take buspirone 7.5 mg twice daily as needed.      Disposition: Keep your July appointment with Dr. Johney Frame  Medication Adjustments/Labs and Tests Ordered: Current medicines are reviewed at length with the patient today.  Concerns regarding medicines are outlined above.  Orders Placed This Encounter  Procedures   TSH   Comp Met (CMET)   Lipid Profile   CBC   EKG 12-Lead   Meds ordered this encounter  Medications   losartan (COZAAR) 25 MG tablet    Sig: Take 1 tablet (25 mg total) by mouth daily.    Dispense:  90 tablet    Refill:  3    Order Specific Question:   Supervising Provider    Answer:   Acie Fredrickson, PHILIP J [[id number]]   busPIRone (BUSPAR) 7.5 MG tablet    Sig: Take 1 tablet (7.5 mg total) by mouth 2 (two) times daily.    Dispense:  180 tablet    Refill:  3    Order Specific Question:   Supervising Provider    Answer:   Thayer Headings [phone]    Patient Instructions  Medication Instructions:   START Losartan one (1) tablet by mouth (25 mg) daily.  START Buspar one (1) tablet by mouth (7.5 mg) twice daily.   *If you need a refill on your cardiac medications before your next appointment, please call your pharmacy*  Lab Work:  Your physician recommends that you return for a FASTING lipid profile/cmet/cbc/tsh. Tuesday, March 19. You can come in on the day of your appointment anytime between 7:30-4:30 fasting from midnight the night before.   If you have labs (blood work) drawn today and your tests are completely normal, you will receive your results only by: [place] (if you have MyChart) OR A paper copy in the mail If you have any lab test that is abnormal or we need to change your treatment, we will call you to review the results.   Testing/Procedures:  None  ordered.   Follow-Up: At [facility], you and your health needs are our priority.  As part of our continuing mission to provide you with exceptional heart care, we have created designated Provider Care Teams.  These Care Teams include your primary Cardiologist (physician) and Advanced Practice Providers (APPs -  Physician Assistants and Nurse Practitioners) who all work together to provide you with the care you need, when you need it.  We recommend signing up for the patient portal called "MyChart".  Sign up information is provided on this After Visit Summary.  MyChart is used to connect with patients for Virtual Visits (Telemedicine).  Patients are able to view lab/test results, encounter notes, upcoming appointments, etc.  Non-urgent messages can be sent to your provider as well.   To learn more about what you can do with MyChart, go to [email or web address].    Your next appointment:   4 months  Provider:   Gwyndolyn Kaufman, MD.    Other Instructions  HOW TO TAKE YOUR BLOOD PRESSURE  Rest 5 minutes before taking your blood pressure. Don't  smoke or drink caffeinated beverages for at least 30 minutes before. Take your blood pressure before (not after) you eat. Sit comfortably with your back supported and both feet on the floor ( don't cross your legs). Elevate your arm to heart level on a table or a desk. Use the proper sized cuff.  It should fit smoothly and snugly around your bare upper arm.  There should  be  Enough room to slip a fingertip under the cuff.  The bottom edge of the cuff should be 1 inch above the crease Of the elbow. Please monitor your blood pressure once daily 2 hours after your am medication. If you blood pressure Consistently remains above [id number] (systolic) top number or over 80 ( diastolic) bottom number X 3 days  Consecutively.  Please call our office at [phone] or send Mychart message.     ----Avoid cold medicines with D or DM at the end of  them----       Signed, Emmaline Life, NP  12/27/2022 4:47 PM    [place]

## 2022-12-27 ENCOUNTER — Encounter: Payer: Self-pay | Admitting: Nurse Practitioner

## 2022-12-27 ENCOUNTER — Ambulatory Visit: Payer: Medicare Other | Attending: Nurse Practitioner | Admitting: Nurse Practitioner

## 2022-12-27 VITALS — BP 138/68 | HR 70 | Ht 63.5 in | Wt 164.2 lb

## 2022-12-27 DIAGNOSIS — R002 Palpitations: Secondary | ICD-10-CM | POA: Diagnosis not present

## 2022-12-27 DIAGNOSIS — I10 Essential (primary) hypertension: Secondary | ICD-10-CM

## 2022-12-27 DIAGNOSIS — I350 Nonrheumatic aortic (valve) stenosis: Secondary | ICD-10-CM | POA: Diagnosis not present

## 2022-12-27 DIAGNOSIS — F419 Anxiety disorder, unspecified: Secondary | ICD-10-CM | POA: Diagnosis not present

## 2022-12-27 DIAGNOSIS — I493 Ventricular premature depolarization: Secondary | ICD-10-CM | POA: Diagnosis not present

## 2022-12-27 MED ORDER — BUSPIRONE HCL 7.5 MG PO TABS: 7.5000 mg | ORAL_TABLET | Freq: Two times a day (BID) | ORAL | 3 refills | Status: DC

## 2022-12-27 MED ORDER — LOSARTAN POTASSIUM 25 MG PO TABS: 25.0000 mg | ORAL_TABLET | Freq: Every day | ORAL | 3 refills | Status: DC

## 2022-12-27 NOTE — Patient Instructions (Addendum)
Medication Instructions:   START Losartan one (1) tablet by mouth (25 mg) daily.  START Buspar one (1) tablet by mouth (7.5 mg) twice daily.   *If you need a refill on your cardiac medications before your next appointment, please call your pharmacy*  Lab Work:  Your physician recommends that you return for a FASTING lipid profile/cmet/cbc/tsh. Tuesday, March 19. You can come in on the day of your appointment anytime between 7:30-4:30 fasting from midnight the night before.   If you have labs (blood work) drawn today and your tests are completely normal, you will receive your results only by: [place] (if you have MyChart) OR A paper copy in the mail If you have any lab test that is abnormal or we need to change your treatment, we will call you to review the results.   Testing/Procedures:  None ordered.   Follow-Up: At [facility], you and your health needs are our priority.  As part of our continuing mission to provide you with exceptional heart care, we have created designated Provider Care Teams.  These Care Teams include your primary Cardiologist (physician) and Advanced Practice Providers (APPs -  Physician Assistants and Nurse Practitioners) who all work together to provide you with the care you need, when you need it.  We recommend signing up for the patient portal called "MyChart".  Sign up information is provided on this After Visit Summary.  MyChart is used to connect with patients for Virtual Visits (Telemedicine).  Patients are able to view lab/test results, encounter notes, upcoming appointments, etc.  Non-urgent messages can be sent to your provider as well.   To learn more about what you can do with MyChart, go to [email or web address].    Your next appointment:   4 months  Provider:   Gwyndolyn Kaufman, MD.    Other Instructions  HOW TO TAKE YOUR BLOOD PRESSURE  Rest 5 minutes before taking your blood pressure. Don't  smoke or drink  caffeinated beverages for at least 30 minutes before. Take your blood pressure before (not after) you eat. Sit comfortably with your back supported and both feet on the floor ( don't cross your legs). Elevate your arm to heart level on a table or a desk. Use the proper sized cuff.  It should fit smoothly and snugly around your bare upper arm.  There should be  Enough room to slip a fingertip under the cuff.  The bottom edge of the cuff should be 1 inch above the crease Of the elbow. Please monitor your blood pressure once daily 2 hours after your am medication. If you blood pressure Consistently remains above [id number] (systolic) top number or over 80 ( diastolic) bottom number X 3 days  Consecutively.  Please call our office at [phone] or send Mychart message.     ----Avoid cold medicines with D or DM at the end of them----

## 2023-01-10 ENCOUNTER — Ambulatory Visit: Payer: Medicare Other | Attending: Nurse Practitioner

## 2023-01-10 DIAGNOSIS — I350 Nonrheumatic aortic (valve) stenosis: Secondary | ICD-10-CM | POA: Diagnosis not present

## 2023-01-10 DIAGNOSIS — I10 Essential (primary) hypertension: Secondary | ICD-10-CM

## 2023-01-10 DIAGNOSIS — I493 Ventricular premature depolarization: Secondary | ICD-10-CM | POA: Diagnosis not present

## 2023-01-10 LAB — COMPREHENSIVE METABOLIC PANEL

## 2023-01-10 LAB — LIPID PANEL

## 2023-01-10 LAB — TSH

## 2023-01-11 LAB — CBC
Hematocrit: 40.7 % (ref 34.0–46.6)
Hemoglobin: 13.5 g/dL (ref 11.1–15.9)
MCH: 30.5 pg (ref 26.6–33.0)
MCHC: 33.2 g/dL (ref 31.5–35.7)
MCV: 92 fL (ref 79–97)
Platelets: 152 10*3/uL (ref 150–450)
RBC: 4.43 x10E6/uL (ref 3.77–5.28)
RDW: 13.4 % (ref 11.7–15.4)
WBC: 6.6 10*3/uL (ref 3.4–10.8)

## 2023-01-11 LAB — COMPREHENSIVE METABOLIC PANEL
ALT: 21 IU/L (ref 0–32)
AST: 21 IU/L (ref 0–40)
Albumin/Globulin Ratio: 1.6 (ref 1.2–2.2)
Alkaline Phosphatase: 66 IU/L (ref 44–121)
BUN/Creatinine Ratio: 16 (ref 12–28)
BUN: 14 mg/dL (ref 8–27)
Bilirubin Total: 0.3 mg/dL (ref 0.0–1.2)
CO2: 22 mmol/L (ref 20–29)
Calcium: 9 mg/dL (ref 8.7–10.3)
Chloride: 109 mmol/L — ABNORMAL HIGH (ref 96–106)
Creatinine, Ser: 0.85 mg/dL (ref 0.57–1.00)
Globulin, Total: 2.5 g/dL (ref 1.5–4.5)
Glucose: 93 mg/dL (ref 70–99)
Potassium: 4.4 mmol/L (ref 3.5–5.2)
Sodium: 145 mmol/L — ABNORMAL HIGH (ref 134–144)
eGFR: 71 mL/min/{1.73_m2} (ref >59)

## 2023-01-11 LAB — LIPID PANEL
Chol/HDL Ratio: 5 ratio — ABNORMAL HIGH (ref 0.0–4.4)
Cholesterol, Total: 210 mg/dL — ABNORMAL HIGH (ref 100–199)
HDL: 42 mg/dL (ref >39)
LDL Chol Calc (NIH): 143 mg/dL — ABNORMAL HIGH (ref 0–99)
Triglycerides: 137 mg/dL (ref 0–149)
VLDL Cholesterol Cal: 25 mg/dL (ref 5–40)

## 2023-02-08 ENCOUNTER — Telehealth: Payer: Self-pay | Admitting: *Deleted

## 2023-02-08 ENCOUNTER — Other Ambulatory Visit: Payer: Self-pay | Admitting: *Deleted

## 2023-02-08 DIAGNOSIS — Z79899 Other long term (current) drug therapy: Secondary | ICD-10-CM

## 2023-02-08 MED ORDER — LOSARTAN POTASSIUM 25 MG PO TABS: 50.0000 mg | ORAL_TABLET | Freq: Every day | ORAL | 3 refills | Status: DC

## 2023-02-08 NOTE — Telephone Encounter (Signed)
Good morning! will you please schedule her a lab appt for May 1 for bmet and place order for bmet as well as increase losartan to 50 mg. she is going to take 2 of her 25 mg tablets for now

## 2023-02-22 ENCOUNTER — Ambulatory Visit: Payer: Medicare Other | Attending: Nurse Practitioner

## 2023-02-22 DIAGNOSIS — Z79899 Other long term (current) drug therapy: Secondary | ICD-10-CM | POA: Diagnosis not present

## 2023-02-22 LAB — BASIC METABOLIC PANEL
BUN/Creatinine Ratio: 20 (ref 12–28)
BUN: 16 mg/dL (ref 8–27)
CO2: 23 mmol/L (ref 20–29)
Calcium: 9.3 mg/dL (ref 8.7–10.3)
Chloride: 108 mmol/L — ABNORMAL HIGH (ref 96–106)
Creatinine, Ser: 0.82 mg/dL (ref 0.57–1.00)
Glucose: 82 mg/dL (ref 70–99)
Potassium: 4.5 mmol/L (ref 3.5–5.2)
Sodium: 143 mmol/L (ref 134–144)
eGFR: 74 mL/min/{1.73_m2} (ref >59)

## 2023-02-24 ENCOUNTER — Other Ambulatory Visit: Payer: Self-pay

## 2023-02-24 MED ORDER — METOPROLOL TARTRATE 25 MG PO TABS: 37.5000 mg | ORAL_TABLET | Freq: Two times a day (BID) | ORAL | 3 refills | Status: DC

## 2023-03-16 ENCOUNTER — Ambulatory Visit: Payer: Medicare Other | Admitting: Nurse Practitioner

## 2023-04-17 DIAGNOSIS — L570 Actinic keratosis: Secondary | ICD-10-CM | POA: Diagnosis not present

## 2023-04-17 DIAGNOSIS — L2089 Other atopic dermatitis: Secondary | ICD-10-CM | POA: Diagnosis not present

## 2023-04-17 DIAGNOSIS — L400 Psoriasis vulgaris: Secondary | ICD-10-CM | POA: Diagnosis not present

## 2023-04-27 NOTE — Progress Notes (Signed)
Cardiology Office Note:   Date:  04/28/2023  ID:  Karina Morris, DOB 08-03-1946, MRN [id number]  History of Present Illness:   Karina Morris is a 77 y.o. female with history of frequent PVCs (9% on cardiac monitor), mild AS, and HTN who was previously followed by Dr. Katrinka Blazing who now presents to clinic for follow-up.  Patient initially referred to Dr. Katrinka Blazing in 07/2021 for evaluation of palpitations and systolic murmur. TTE showed LVEF 60-65%, G1DD, normal RV function, mild to moderate AS. Zio 07/2021 with 9.6% burden of PVCs. Repeat TTE 08/2022 with LVEF 75%, mild LVH, mild AS with mean gradient .   Was last seen in clinic on 12/2022 where she was stable from a CV standpoint.   Today, the patient is overwhelmed that all her physicians are retiring. 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 Otherwise feeling well from a CV standpoint. No chest pain, SOB, LE edema, orthopnea or PND. Palpitations significantly improved with metoprolol.  Past Medical History:  Diagnosis Date   Anxiety    Anxiety    Arthritis    neck   Hypertension    Osteopenia    Symptomatic PVCs    Systolic murmur    Vitamin D deficiency      ROS: As per HPI  Studies Reviewed:    EKG:  No new tracing  Cardiac Studies & Procedures     STRESS TESTS  EXERCISE TOLERANCE TEST (ETT) 10/07/2021  Narrative   No ST deviation was noted.   The patient walked for 4 minutes and 1 seconds of a standard Bruce protocol treadmill test.   She achieved a peak heart rate of 144 which is 99% predicted maximal heart rate.   At peak exercise there were no ST or T wave changes to suggest ischemia.  There was no QRS widening at peak exercise.   She had a hypertensive response to exercise.   This is interpreted as a negative stress test.   ECHOCARDIOGRAM  ECHOCARDIOGRAM COMPLETE 09/01/2022  Narrative ECHOCARDIOGRAM REPORT    Patient Name:   Karina Morris Date of Exam: 09/01/2022 Medical  Rec #:  [id number]      Height:       63.5 in Accession #:    [id number]     Weight:       166.4 lb Date of Birth:  [birth year]       BSA:          1.799 m Patient Age:    76 years       BP:           124/60 mmHg Patient Gender: F              HR:           69 bpm. Exam Location:  [address]  Procedure: 2D Echo, Cardiac Doppler and Color Doppler  Indications:    I35.0 Nonrheumatic aortic (valve) stenosis  History:        Patient has prior history of Echocardiogram examinations, most recent 08/24/2021. Arrythmias:PVC, Signs/Symptoms:Murmur; Risk Factors:Hypertension. Palpitations.  Sonographer:    Cathie Beams RCS Referring Phys: [phone] Barry Dienes [facility]  IMPRESSIONS   1. Left ventricular ejection fraction, by estimation, is >75%. The left ventricle has hyperdynamic function. The left ventricle has no regional wall motion abnormalities. There is mild left ventricular hypertrophy. Left ventricular diastolic parameters are consistent with Grade I diastolic dysfunction (impaired relaxation). 2. Right ventricular systolic function is  normal. The right ventricular size is normal. 3. The mitral valve is normal in structure. No evidence of mitral valve regurgitation. No evidence of mitral stenosis. Moderate mitral annular calcification. 4. The aortic valve is calcified. There is mild calcification of the aortic valve. Aortic valve regurgitation is trivial. Aortic valve sclerosis is present, with no evidence of aortic valve stenosis. Aortic valve mean gradient measures 10.0 mmHg. Aortic valve Vmax measures 2.13 m/s. 5. The inferior vena cava is normal in size with greater than 50% respiratory variability, suggesting right atrial pressure of 3 mmHg.  Comparison(s): Prior images reviewed side by side. Prior mean AV gradient was 14 mmHg.  FINDINGS Left Ventricle: Left ventricular ejection fraction, by estimation, is >75%. The left ventricle has hyperdynamic function. The left ventricle has no regional  wall motion abnormalities. The left ventricular internal cavity size was normal in size. There is mild left ventricular hypertrophy. Left ventricular diastolic parameters are consistent with Grade I diastolic dysfunction (impaired relaxation).  Right Ventricle: The right ventricular size is normal. No increase in right ventricular wall thickness. Right ventricular systolic function is normal.  Left Atrium: Left atrial size was normal in size.  Right Atrium: Right atrial size was normal in size.  Pericardium: There is no evidence of pericardial effusion.  Mitral Valve: The mitral valve is normal in structure. Moderate mitral annular calcification. No evidence of mitral valve regurgitation. No evidence of mitral valve stenosis.  Tricuspid Valve: The tricuspid valve is normal in structure. Tricuspid valve regurgitation is not demonstrated. No evidence of tricuspid stenosis.  Aortic Valve: The aortic valve is calcified. There is mild calcification of the aortic valve. Aortic valve regurgitation is trivial. Aortic valve sclerosis is present, with no evidence of aortic valve stenosis. Aortic valve mean gradient measures 10.0 mmHg. Aortic valve peak gradient measures 18.1 mmHg. Aortic valve area, by VTI measures 1.20 cm.  Pulmonic Valve: The pulmonic valve was normal in structure. Pulmonic valve regurgitation is not visualized. No evidence of pulmonic stenosis.  Aorta: The aortic root is normal in size and structure.  Venous: The inferior vena cava is normal in size with greater than 50% respiratory variability, suggesting right atrial pressure of 3 mmHg.  IAS/Shunts: No atrial level shunt detected by color flow Doppler.   LEFT VENTRICLE PLAX 2D LVIDd:         4.00 cm   Diastology LVIDs:         1.80 cm   LV e' medial:    6.64 cm/s LV PW:         1.20 cm   LV E/e' medial:  16.6 LV IVS:        1.00 cm   LV e' lateral:   6.40 cm/s LVOT diam:     2.00 cm   LV E/e' lateral: 17.2 LV SV:          58 LV SV Index:   32 LVOT Area:     3.14 cm   RIGHT VENTRICLE RV Basal diam:  2.60 cm RV S prime:     15.30 cm/s TAPSE (M-mode): 1.8 cm RVSP:           24.5 mmHg  LEFT ATRIUM             Index        RIGHT ATRIUM           Index LA diam:        3.80 cm 2.11 cm/m   RA Pressure: 3.00 mmHg LA Vol (  A2C):   23.1 ml 12.84 ml/m  RA Area:     7.93 cm LA Vol (A4C):   25.6 ml 14.23 ml/m  RA Volume:   12.10 ml  6.73 ml/m LA Biplane Vol: 24.5 ml 13.62 ml/m AORTIC VALVE AV Area (Vmax):    1.25 cm AV Area (Vmean):   1.27 cm AV Area (VTI):     1.20 cm AV Vmax:           213.00 cm/s AV Vmean:          146.000 cm/s AV VTI:            0.483 m AV Peak Grad:      18.1 mmHg AV Mean Grad:      10.0 mmHg LVOT Vmax:         84.90 cm/s LVOT Vmean:        59.000 cm/s LVOT VTI:          0.184 m LVOT/AV VTI ratio: 0.38  AORTA Ao Root diam: 2.30 cm Ao Asc diam:  2.50 cm  MITRAL VALVE                TRICUSPID VALVE MV Area (PHT): 4.10 cm     TR Peak grad:   21.5 mmHg MV Decel Time: 185 msec     TR Vmax:        232.00 cm/s MV E velocity: 110.00 cm/s  Estimated RAP:  3.00 mmHg MV A velocity: 131.00 cm/s  RVSP:           24.5 mmHg MV E/A ratio:  0.84 SHUNTS Systemic VTI:  0.18 m Systemic Diam: 2.00 cm  Donato Schultz MD Electronically signed by Donato Schultz MD Signature Date/Time: 09/01/2022/2:32:23 PM    Final    MONITORS  LONG TERM MONITOR (3-14 DAYS) 09/10/2021  Narrative  Normal sinus rhythm  Non sustained VT, 12 beat salvo  Non sustaiend SVT withm 11 beat salvo being longest  Frequent PVC's with 9.6% burden  No symptoms reported   Patch Wear Time:  14 days and 0 hours (2022-10-24T13:23:22-0400 to 2022-11-07T12:23:26-0500)  Patient had a min HR of 59 bpm, max HR of 179 bpm, and avg HR of 84 bpm. Predominant underlying rhythm was Sinus Rhythm. 1 run of Ventricular Tachycardia occurred lasting 12 beats with a max rate of 143 bpm (avg 134 bpm). 3 Supraventricular  Tachycardia runs occurred, the run with the fastest interval lasting 11 beats with a max rate of 179 bpm (avg 166 bpm); the run with the fastest interval was also the longest. Isolated SVEs were rare (<1.0%), SVE Couplets were rare (<1.0%), and SVE Triplets were rare (<1.0%). Isolated VEs were frequent (9.6%, [id number]), VE Couplets were rare (<1.0%, 169), and no VE Triplets were present. Ventricular Bigeminy and Trigeminy were present.            Risk Assessment/Calculations:      Physical Exam:   VS:  BP (!) 140/68   Pulse 68   Ht 5' 3.5" (1.613 m)   Wt 163 lb (73.9 kg)   SpO2 98%   BMI 28.42 kg/m    Wt Readings from Last 3 Encounters:  04/28/23 163 lb (73.9 kg)  12/27/22 164 lb 3.2 oz (74.5 kg)  01/10/22 166 lb 6.4 oz (75.5 kg)     GEN: Well nourished, well developed in no acute distress NECK: No JVD; No carotid bruits CARDIAC: RRR, 2/6 systolic murmur. No rubs, gallops RESPIRATORY:  Clear to auscultation without rales, wheezing  or rhonchi  ABDOMEN: Soft, non-tender, non-distended EXTREMITIES:  No edema; No deformity   ASSESSMENT AND PLAN:   #Symptomatic PVCs: -Zio with 9% burden of PVCs -Currently, palpitations are significantly improved -Continue metop 37.5mg  BID  #Mild Aortic Stenosis: -TTE with LVEF 75%, mean gradient -Continue serial monitoring with repeat TTE 1-2 years  #HTN: -Mildly elevated in 130-150s at home -Increase losartan to 100mg  daily -Continue metop 37.5mg  BID -Check BMET in 1 week  #HLD: -LDL 143 -Start crestor 10mg  daily -Repeat lipids in 3 months          Signed, Meriam Sprague, MD

## 2023-04-28 ENCOUNTER — Encounter: Payer: Self-pay | Admitting: Cardiology

## 2023-04-28 ENCOUNTER — Ambulatory Visit: Payer: Medicare Other | Attending: Cardiology | Admitting: Cardiology

## 2023-04-28 VITALS — BP 140/68 | HR 68 | Ht 63.5 in | Wt 163.0 lb

## 2023-04-28 DIAGNOSIS — I493 Ventricular premature depolarization: Secondary | ICD-10-CM | POA: Insufficient documentation

## 2023-04-28 DIAGNOSIS — E7800 Pure hypercholesterolemia, unspecified: Secondary | ICD-10-CM | POA: Insufficient documentation

## 2023-04-28 DIAGNOSIS — I10 Essential (primary) hypertension: Secondary | ICD-10-CM

## 2023-04-28 DIAGNOSIS — I350 Nonrheumatic aortic (valve) stenosis: Secondary | ICD-10-CM

## 2023-04-28 MED ORDER — ROSUVASTATIN CALCIUM 10 MG PO TABS: 10.0000 mg | ORAL_TABLET | Freq: Every day | ORAL | 3 refills | Status: DC

## 2023-04-28 MED ORDER — LOSARTAN POTASSIUM 100 MG PO TABS: 100.0000 mg | ORAL_TABLET | Freq: Every day | ORAL | 3 refills | Status: DC

## 2023-04-28 NOTE — Patient Instructions (Signed)
Medication Instructions:  Your physician has recommended you make the following change in your medication:   Start Crestor 10 mg Daily  Increase Losartan to 100 mg daily   *If you need a refill on your cardiac medications before your next appointment, please call your pharmacy*   Lab Work: BMET 1 week  Lipids 3 months with F/U appointment  If you have labs (blood work) drawn today and your tests are completely normal, you will receive your results only by: MyChart Message (if you have MyChart) OR A paper copy in the mail If you have any lab test that is abnormal or we need to change your treatment, we will call you to review the results.   Follow-Up: At [facility], you and your health needs are our priority.  As part of our continuing mission to provide you with exceptional heart care, we have created designated Provider Care Teams.  These Care Teams include your primary Cardiologist (physician) and Advanced Practice Providers (APPs -  Physician Assistants and Nurse Practitioners) who all work together to provide you with the care you need, when you need it.    Your next appointment:   3 month(s)  Provider:   Eligha Bridegroom, NP

## 2023-05-08 ENCOUNTER — Ambulatory Visit: Payer: Medicare Other | Attending: Cardiology

## 2023-05-08 DIAGNOSIS — I10 Essential (primary) hypertension: Secondary | ICD-10-CM

## 2023-05-08 DIAGNOSIS — I350 Nonrheumatic aortic (valve) stenosis: Secondary | ICD-10-CM | POA: Diagnosis not present

## 2023-05-08 DIAGNOSIS — I493 Ventricular premature depolarization: Secondary | ICD-10-CM | POA: Diagnosis not present

## 2023-05-08 DIAGNOSIS — E7800 Pure hypercholesterolemia, unspecified: Secondary | ICD-10-CM

## 2023-05-09 LAB — BASIC METABOLIC PANEL
BUN/Creatinine Ratio: 16 (ref 12–28)
BUN: 15 mg/dL (ref 8–27)
CO2: 22 mmol/L (ref 20–29)
Calcium: 9.3 mg/dL (ref 8.7–10.3)
Chloride: 105 mmol/L (ref 96–106)
Creatinine, Ser: 0.94 mg/dL (ref 0.57–1.00)
Glucose: 82 mg/dL (ref 70–99)
Potassium: 4.5 mmol/L (ref 3.5–5.2)
Sodium: 142 mmol/L (ref 134–144)
eGFR: 62 mL/min/{1.73_m2} (ref >59)

## 2023-07-04 ENCOUNTER — Other Ambulatory Visit: Payer: Self-pay

## 2023-07-04 MED ORDER — ROSUVASTATIN CALCIUM 10 MG PO TABS: 10.0000 mg | ORAL_TABLET | Freq: Every day | ORAL | 3 refills | Status: DC

## 2023-07-04 MED ORDER — LOSARTAN POTASSIUM 100 MG PO TABS: 100.0000 mg | ORAL_TABLET | Freq: Every day | ORAL | 3 refills | Status: DC

## 2023-07-05 DIAGNOSIS — Z23 Encounter for immunization: Secondary | ICD-10-CM | POA: Diagnosis not present

## 2023-07-31 NOTE — Progress Notes (Unsigned)
Cardiology Office Note:  .   Date:  08/01/2023  ID:  Karina Morris, DOB 12/23/1945, MRN [id number] PCP: [facility] Providers Cardiologist:  Meriam Sprague, MD    Patient Profile: .      PMH Palpitations Frequent PVCs 9% burden on cardiac monitor Moderate aortic stenosis Echo 09/01/2022 Mild calcification of AV with no stenosis AV mean gradient 10 mmHg, V-max 2.13 m/s Hypertension Diastolic dysfunction Anxiety  Initially referred to Dr. Katrinka Blazing 07/2021 for evaluation of palpitations and systolic murmurm. TTE showed LVEF 60-65%, G1DD, normal RV function, mild to moderate AS. Zio 07/2021 with 9.6% burden of PVCs. ETT 10/07/2021 revealed hypertensive response to exercise, negative stress test. Repeat TTE 08/2022 with LVEF 75%, mild LVH, mild AS with mean gradient 10 mmHg. 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 Palpitations significantly improved with metoprolol 37.5 mg BID.   Seen by Dr. Shari Prows 04/28/23. Recommendation to repeat TTE 1-2 years unless clinically indicated sooner. Rosuvastatin 10 mg daily was added. Losartan was increased to 100 mg for elevated BP. BMP one week later revealed stable renal function.        History of Present Illness: .   Karina Morris is a very pleasant 77 y.o. female who is here today for follow-up. She reports she is feeling well. Has had a stressful year since her husband had CABG in January.  And has a son who lives in the projected path of hurricane coming to [place].  She has been monitoring home BP consistently 2 hours after taking morning medications. SBP ranges from 110s to 150s.  She eats a very healthy diet and is careful to avoid sodium and fat as well as simple carbohydrates and processed foods.  She and her husband walk daily as well as do home exercises that were taught by PT. She you take care denies chest pain, shortness of breath, lower extremity edema,  fatigue, palpitations, melena, presyncope, syncope, orthopnea, and PND. Discussed CT calcium score for further risk stratification.    ROS: See HPI       Studies Reviewed: .         Risk Assessment/Calculations:             Physical Exam:   VS:  BP 120/64 (BP Location: Left Arm, Patient Position: Sitting, Cuff Size: Normal)   Pulse 70   Ht 5' 3.5" (1.613 m)   Wt 165 lb 6.4 oz (75 kg)   SpO2 96%   BMI 28.84 kg/m    Wt Readings from Last 3 Encounters:  08/01/23 165 lb 6.4 oz (75 kg)  04/28/23 163 lb (73.9 kg)  12/27/22 164 lb 3.2 oz (74.5 kg)    GEN: Well nourished, well developed in no acute distress NECK: No JVD; No carotid bruits CARDIAC: RRR, systolic murmur. No rubs, gallops RESPIRATORY:  Clear to auscultation without rales, wheezing or rhonchi  ABDOMEN: Soft, non-tender, non-distended EXTREMITIES:  No edema; No deformity     ASSESSMENT AND PLAN: .    Hypertension: BP is somewhat variable with SBP ranging from 110s to 150s at home.  Slightly higher on my recheck at 138/68.  Lengthy discussion about goal BP < 120/80 with some grace for SBP 120-130 mmHg.  We will change losartan to valsartan 160 mg daily.  Advised her to continue to monitor and send in readings in a few weeks.  We will get BMP in 3 weeks as it will  take 1 week for her to get medication from mail order pharmacy.  Continue Lopressor 12.5 mg twice daily.  Aortic stenosis: TTE 09/01/22 with LVEF 75%, mean gradient 10 mmHg.  She is asymptomatic.  I do not appreciate a significant murmur on exam.  We will continue to monitor clinically at this time and consider repeat echo in 2 years, sooner if clinically indicated.  Palpitations: History of PVCs and one 12 beat run of NSVT on cardiac monitor 08/2021. Overall these are well-controlled on metoprolol. No acute concerns today. Continue metoprolol.   Hyperlipidemia LDL goal < 70: LDL 143 on 1/61/09. Started rosuvastatin 10 mg daily at last office visit 04/28/23. She had  blood drawn today for repeat lipid panel. Discussed CT calcium score for further risk stratification. She will consider. Continue heart healthy diet and regular exercise. Continue rosuvastatin.   Anxiety: Reports anxiety is well-controlled on buspirone as well as faith-based practices.        Dispo: 1 year with me  Signed, Eligha Bridegroom, NP-C

## 2023-08-01 ENCOUNTER — Encounter: Payer: Self-pay | Admitting: Nurse Practitioner

## 2023-08-01 ENCOUNTER — Ambulatory Visit: Payer: Medicare Other | Attending: Nurse Practitioner | Admitting: Nurse Practitioner

## 2023-08-01 ENCOUNTER — Ambulatory Visit: Payer: Medicare Other

## 2023-08-01 VITALS — BP 120/64 | HR 70 | Ht 63.5 in | Wt 165.4 lb

## 2023-08-01 DIAGNOSIS — E7800 Pure hypercholesterolemia, unspecified: Secondary | ICD-10-CM | POA: Diagnosis not present

## 2023-08-01 DIAGNOSIS — I10 Essential (primary) hypertension: Secondary | ICD-10-CM

## 2023-08-01 DIAGNOSIS — E785 Hyperlipidemia, unspecified: Secondary | ICD-10-CM

## 2023-08-01 DIAGNOSIS — F419 Anxiety disorder, unspecified: Secondary | ICD-10-CM | POA: Diagnosis not present

## 2023-08-01 DIAGNOSIS — I350 Nonrheumatic aortic (valve) stenosis: Secondary | ICD-10-CM | POA: Diagnosis present

## 2023-08-01 DIAGNOSIS — I493 Ventricular premature depolarization: Secondary | ICD-10-CM

## 2023-08-01 DIAGNOSIS — Z79899 Other long term (current) drug therapy: Secondary | ICD-10-CM | POA: Diagnosis present

## 2023-08-01 MED ORDER — VALSARTAN 160 MG PO TABS: 160.0000 mg | ORAL_TABLET | Freq: Every day | ORAL | 3 refills | Status: DC

## 2023-08-01 NOTE — Patient Instructions (Addendum)
Medication Instructions:   DISCONTINUE Losartan  START Valsartan one (1) tablet by mouth (160 mg) daily.   *If you need a refill on your cardiac medications before your next appointment, please call your pharmacy*  Lab Work:  Your physician recommends that you return for lab work on Friday, October 31. You can come in on the day of your appointment anytime between 7:30 am -4:30 pm fasting from midnight the night before. Closed from 12:30-1:30 for lunch.   If you have labs (blood work) drawn today and your tests are completely normal, you will receive your results only by: MyChart Message (if you have MyChart) OR A paper copy in the mail If you have any lab test that is abnormal or we need to change your treatment, we will call you to review the results.   Testing/Procedures:  None ordered.   Follow-Up: At [facility], you and your health needs are our priority.  As part of our continuing mission to provide you with exceptional heart care, we have created designated Provider Care Teams.  These Care Teams include your primary Cardiologist (physician) and Advanced Practice Providers (APPs -  Physician Assistants and Nurse Practitioners) who all work together to provide you with the care you need, when you need it.  We recommend signing up for the patient portal called "MyChart".  Sign up information is provided on this After Visit Summary.  MyChart is used to connect with patients for Virtual Visits (Telemedicine).  Patients are able to view lab/test results, encounter notes, upcoming appointments, etc.  Non-urgent messages can be sent to your provider as well.   To learn more about what you can do with MyChart, go to [email or web address].    Your next appointment:   1 year(s)  Provider:   Eligha Bridegroom, NP         Other Instructions  Your physician wants you to follow-up in: 1 year.  You will receive a reminder letter in the mail two months in advance. If you don't  receive a letter, please call our office to schedule the follow-up appointment.

## 2023-08-02 LAB — LIPID PANEL
Chol/HDL Ratio: 3.3 {ratio} (ref 0.0–4.4)
Cholesterol, Total: 129 mg/dL (ref 100–199)
HDL: 39 mg/dL — ABNORMAL LOW (ref >39)
LDL Chol Calc (NIH): 62 mg/dL (ref 0–99)
Triglycerides: 167 mg/dL — ABNORMAL HIGH (ref 0–149)
VLDL Cholesterol Cal: 28 mg/dL (ref 5–40)

## 2023-08-24 ENCOUNTER — Ambulatory Visit: Payer: Medicare Other | Attending: Nurse Practitioner

## 2023-08-24 DIAGNOSIS — I350 Nonrheumatic aortic (valve) stenosis: Secondary | ICD-10-CM

## 2023-08-24 DIAGNOSIS — Z79899 Other long term (current) drug therapy: Secondary | ICD-10-CM

## 2023-08-24 DIAGNOSIS — I493 Ventricular premature depolarization: Secondary | ICD-10-CM

## 2023-08-24 DIAGNOSIS — I10 Essential (primary) hypertension: Secondary | ICD-10-CM | POA: Diagnosis not present

## 2023-08-25 LAB — BASIC METABOLIC PANEL
BUN/Creatinine Ratio: 22 (ref 12–28)
BUN: 20 mg/dL (ref 8–27)
CO2: 25 mmol/L (ref 20–29)
Calcium: 9.4 mg/dL (ref 8.7–10.3)
Chloride: 105 mmol/L (ref 96–106)
Creatinine, Ser: 0.9 mg/dL (ref 0.57–1.00)
Glucose: 85 mg/dL (ref 70–99)
Potassium: 4.4 mmol/L (ref 3.5–5.2)
Sodium: 142 mmol/L (ref 134–144)
eGFR: 66 mL/min/{1.73_m2} (ref >59)

## 2023-10-11 DIAGNOSIS — D1801 Hemangioma of skin and subcutaneous tissue: Secondary | ICD-10-CM | POA: Diagnosis not present

## 2023-10-11 DIAGNOSIS — L814 Other melanin hyperpigmentation: Secondary | ICD-10-CM | POA: Diagnosis not present

## 2023-10-11 DIAGNOSIS — L821 Other seborrheic keratosis: Secondary | ICD-10-CM | POA: Diagnosis not present

## 2023-10-11 DIAGNOSIS — L578 Other skin changes due to chronic exposure to nonionizing radiation: Secondary | ICD-10-CM | POA: Diagnosis not present

## 2023-10-11 DIAGNOSIS — L570 Actinic keratosis: Secondary | ICD-10-CM | POA: Diagnosis not present

## 2023-10-11 DIAGNOSIS — Z85828 Personal history of other malignant neoplasm of skin: Secondary | ICD-10-CM | POA: Diagnosis not present

## 2023-11-15 ENCOUNTER — Other Ambulatory Visit: Payer: Self-pay | Admitting: Nurse Practitioner

## 2024-01-08 DIAGNOSIS — L400 Psoriasis vulgaris: Secondary | ICD-10-CM | POA: Diagnosis not present

## 2024-01-08 DIAGNOSIS — L570 Actinic keratosis: Secondary | ICD-10-CM | POA: Diagnosis not present

## 2024-01-08 DIAGNOSIS — L408 Other psoriasis: Secondary | ICD-10-CM | POA: Diagnosis not present

## 2024-01-24 DIAGNOSIS — H5213 Myopia, bilateral: Secondary | ICD-10-CM | POA: Diagnosis not present

## 2024-01-24 DIAGNOSIS — H2513 Age-related nuclear cataract, bilateral: Secondary | ICD-10-CM | POA: Diagnosis not present

## 2024-01-24 DIAGNOSIS — H524 Presbyopia: Secondary | ICD-10-CM | POA: Diagnosis not present

## 2024-03-03 ENCOUNTER — Other Ambulatory Visit: Payer: Self-pay | Admitting: Nurse Practitioner

## 2024-06-27 ENCOUNTER — Telehealth: Payer: Self-pay | Admitting: Nurse Practitioner

## 2024-06-27 MED ORDER — METOPROLOL TARTRATE 50 MG PO TABS: 50.0000 mg | ORAL_TABLET | Freq: Two times a day (BID) | ORAL | 3 refills | Status: AC

## 2024-06-27 NOTE — Telephone Encounter (Signed)
 Pt c/o medication issue:  1. Name of Medication: metoprolol  tartrate (LOPRESSOR ) 25 MG tablet   2. How are you currently taking this medication (dosage and times per day)?   TAKE ONE AND ONE-HALF TABLETS TWICE A DAY (DOSE CHANGE)    3. Are you having a reaction (difficulty breathing--STAT)? No  4. What is your medication issue? Pt states that she feels medication needs adjusting. Pt says that she has been having more frequent palpitations. Please advise

## 2024-06-27 NOTE — Telephone Encounter (Signed)
 Advised patient, verbalized understanding

## 2024-06-27 NOTE — Telephone Encounter (Signed)
 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  Karina Morris prescribed Buspar  7.5 mg twice a day  Discussed reaching out to PCP however patient stated PCP doesn't listen and just refers out.  Palpitations are getting more frequent, heart feels like it is racing when just sitting around.  Blood pressure usually 140-160/70's, doesn't takes prior to taking Valsartan . Does not know what her HR is running  Is feeling stressed and overwhelmed   Will forward to Karina Morris RAMAN NP

## 2024-06-27 NOTE — Telephone Encounter (Signed)
 She may increase metoprolol  tartrate to 50 mg twice daily. Please ask her to record blood pressure and heart rate and send in readings in 2 weeks, sooner if there are concerns. Call if HR consistently 50 or lower or if she experiences significant lightheadedness.

## 2024-07-11 ENCOUNTER — Telehealth: Payer: Self-pay | Admitting: Nurse Practitioner

## 2024-07-11 MED ORDER — VALSARTAN 160 MG PO TABS: 160.0000 mg | ORAL_TABLET | Freq: Every day | ORAL | 0 refills | Status: DC

## 2024-07-11 NOTE — Telephone Encounter (Signed)
 Pt's medication was sent to pt's pharmacy as requested. Confirmation received.

## 2024-07-11 NOTE — Telephone Encounter (Signed)
*  STAT* If patient is at the pharmacy, call can be transferred to refill team.   1. Which medications need to be refilled? (please list name of each medication and dose if known)   valsartan  (DIOVAN ) 160 MG tablet     2. Would you like to learn more about the convenience, safety, & potential cost savings by using the [facility]? no   3. Are you open to using the [facility] (Type [facility]. ). no   4. Which pharmacy/location (including street and city if local pharmacy) is medication to be sent to? [facility] - [place], [place] - [address]    5. Do they need a 30 day or 90 day supply? 90 day

## 2024-07-12 NOTE — Telephone Encounter (Signed)
 Pt called to let office know that since the medication adjustment she'd been feeling and doing much better. She was asked to give an update in 2 weeks so she called to do so. Please advise.

## 2024-07-31 DIAGNOSIS — Z23 Encounter for immunization: Secondary | ICD-10-CM | POA: Diagnosis not present

## 2024-09-25 ENCOUNTER — Other Ambulatory Visit: Payer: Self-pay | Admitting: Nurse Practitioner

## 2024-09-30 ENCOUNTER — Telehealth: Payer: Self-pay | Admitting: Nurse Practitioner

## 2024-09-30 MED ORDER — ROSUVASTATIN CALCIUM 10 MG PO TABS: 10.0000 mg | ORAL_TABLET | Freq: Every day | ORAL | 0 refills | Status: AC

## 2024-09-30 MED ORDER — VALSARTAN 160 MG PO TABS: 160.0000 mg | ORAL_TABLET | Freq: Every day | ORAL | 0 refills | Status: AC

## 2024-09-30 MED ORDER — BUSPIRONE HCL 7.5 MG PO TABS: 7.5000 mg | ORAL_TABLET | Freq: Two times a day (BID) | ORAL | 3 refills | Status: AC

## 2024-09-30 NOTE — Telephone Encounter (Signed)
 Rosuvastatin  & Valsartan  refill has been sent to [facility].  Will forward Buspar  refill request over to provider for authorization.

## 2024-09-30 NOTE — Telephone Encounter (Signed)
*  STAT* If patient is at the pharmacy, call can be transferred to refill team.   1. Which medications need to be refilled? (please list name of each medication and dose if known) valsartan  (DIOVAN ) 160 MG tablet   busPIRone  (BUSPAR ) 7.5 MG tablet    rosuvastatin  (CRESTOR ) 10 MG tablet (Expired)    2. Would you like to learn more about the convenience, safety, & potential cost savings by using the [facility]? No    3. Are you open to using the [facility] (Type [facility]. No    4. Which pharmacy/location (including street and city if local pharmacy) is medication to be sent to?[facility] - [place], [place] - [address]    5. Do they need a 30 day or 90 day supply? 90 day   Pt has appt set 12/30

## 2024-09-30 NOTE — Telephone Encounter (Signed)
 Buspar  prescription refilled.

## 2024-10-21 NOTE — Progress Notes (Unsigned)
 " Cardiology Office Note:  .   Date:  10/22/2024  ID:  Karina Morris, DOB Nov 02, 1945, MRN [id number] PCP: [facility] Providers Cardiologist:  Powell FORBES Sorrow, MD (Inactive)    Patient Profile: .      PMH Palpitations Frequent PVCs 9% burden on cardiac monitor Moderate aortic stenosis Echo 09/01/2022 Mild calcification of AV with no stenosis AV mean gradient 10 mmHg, V-max 2.13 m/s Hypertension Diastolic dysfunction Anxiety  Initially referred to Dr. Claudene 07/2021 for evaluation of palpitations and systolic murmurm. TTE showed LVEF 60-65%, G1DD, normal RV function, mild to moderate AS. Zio 07/2021 with 9.6% burden of PVCs. ETT 10/07/2021 revealed hypertensive response to exercise, negative stress test. Repeat TTE 08/2022 with LVEF 75%, mild LVH, mild AS with mean gradient 10 mmHg. 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 Palpitations significantly improved with metoprolol  37.5 mg BID.   Seen by Dr. Sorrow 04/28/23. Recommendation to repeat TTE 1-2 years unless clinically indicated sooner. Rosuvastatin  10 mg daily was added. Losartan  was increased to 100 mg for elevated BP. BMP one week later revealed stable renal function.   Seen by me on 08/01/23 for follow-up. Reported feeling well. Had a stressful year since her husband had CABG in January.  And has a son who lives in the projected path of hurricane coming to [place] .  Monitoring home BP consistently 2 hours after taking morning medications. SBP ranges from 110s to 150s.  She eats a very healthy diet and is careful to avoid sodium and fat as well as simple carbohydrates and processed foods.  She and her husband walk daily as well as do home exercises that were taught by PT.  No denies chest pain, shortness of breath, lower extremity edema, fatigue, palpitations, melena, presyncope, syncope, orthopnea, and PND. Discussed CT calcium  score for further risk  stratification. Lipids were well controlled. Palpitations were well controlled on metoprolol .        History of Present Illness: .    History of Present Illness Karina Morris is a very pleasant 78 year old female who presents with concerns about her emotional well-being and stress management related to caregiving responsibilities.  She has history of moderate aortic stenosis. Has marked emotional stress and anxiety related to caregiving for her husband after open heart surgery with complications, and his increasingly aggressive behavior. Her blood pressure fluctuates with these episodes, which she tracks daily. She takes metoprolol , which was increased to 50 mg twice daily approximately 5 months ago with clear improvement in blood pressure and symptoms of palpitations.  Palpitations occur at times of stress.  She takes buspirone  twice daily, which helps her anxiety. She denies chest pain, dyspnea, orthopnea, PND, edema, presyncope, syncope. She is worried about the impact of chronic stress on her heart and is vigilant in monitoring for cardiac symptoms. She manages ongoing family stressors and relies on support from her faith and sister to cope.   ROS: See HPI       Studies Reviewed: SABRA   EKG Interpretation Date/Time:  Tuesday October 22 2024 11:03:02 EST Ventricular Rate:  69 PR Interval:  150 QRS Duration:  80 QT Interval:  388 QTC Calculation: 415 R Axis:   17  Text Interpretation: Normal sinus rhythm Possible Left atrial enlargement When compared with ECG of 11-Nov-2018 12:04, No acute changes Confirmed by Percy Browning [phone]) on 10/22/2024 11:39:36 AM     Risk Assessment/Calculations:  Physical Exam:   VS:  BP 118/66 (BP Location: Right Arm, Patient Position: Sitting, Cuff Size: Normal)   Pulse 69   Ht 5' 3.5 (1.613 m)   Wt 165 lb 8 oz (75.1 kg)   SpO2 94%   BMI 28.86 kg/m    Wt Readings from Last 3 Encounters:  10/22/24 165 lb 8 oz (75.1 kg)  08/01/23 165  lb 6.4 oz (75 kg)  04/28/23 163 lb (73.9 kg)    GEN: Well nourished, well developed in no acute distress NECK: No JVD; No carotid bruits CARDIAC: RRR, systolic murmur. No rubs, gallops RESPIRATORY:  Clear to auscultation without rales, wheezing or rhonchi  ABDOMEN: Soft, non-tender, non-distended EXTREMITIES:  No edema; No deformity     ASSESSMENT AND PLAN: .    Anxiety Significant anxiety associated with her husband's health issues and he has behavioral issues which result and negative behavior toward her.  She is his primary caregiver and does not feel like she can desert him.  She relies on her [employer] faith and a sister who lives in [place]  for support.  She feels she overall manages well but previously took alprazolam 0.5 mg for significant anxiety but the healthcare provider who prescribed it retired.  She is tolerating buspirone  7.5 mg twice daily and feels this has helped symptoms somewhat.  We had a lengthy discussion about using alprazolam sparingly and not taking it regularly. - Will provide Rx for alprazolam 0.5 mg number 30 tablets with 1 refill to use as needed for significant anxiety and stress - Continue buspirone  7.5 mg twice daily  Hypertension BP is well-controlled in clinic today.  Is somewhat variable with SBP ranging from 110s to 150s at home.  BP elevations primarily related to stress with her husband at home. Otherwise, is well controlled. She generally eats a low sodium, heart healthy diet.  - Continue valsartan  and metoprolol   Aortic stenosis TTE 09/01/22 with LVEF 75%, mild calcification of aortic valve with mean gradient 10 mmHg.  She is asymptomatic.  I do not appreciate a significant murmur on exam.   - Continue to monitor clinically at this time  - Consider repeat echo at next office visit, sooner if clinically indicated  Palpitations PVCs History of PVCs and one 12 beat run of NSVT on cardiac monitor 08/2021.  She has palpitations that are worse during  times of stress and anxiety.  These times have improved on increased dose of metoprolol .   - Continue metoprolol  tartrate 50 mg twice daily  - Report worsening symptoms   Hyperlipidemia LDL goal < 70 Cardiac risk LDL 143 on 01/07/23 at which time she was started on rosuvastatin  10 mg daily.  Repeat lipid panel 08/01/2023 with total cholesterol 129, triglycerides 167, HDL 39, and LDL-C 62.  We need to update lipid panel.  We discussed coronary CT for further risk stratification. She denies chest pain, dyspnea, or other symptoms concerning for angina.  No indication for further ischemic evaluation at this time. - Consider CT calcium  score for screening for ASCVD - Continue rosuvastatin  10 mg daily - Return for fasting lipid panel at earliest convenience  - Heart healthy diet avoiding processed foods, saturated fat, sugar, and other simple carbohydrates encouraged - Be as physically active as possible every day and aim for at least 150 minutes of moderate intensity exercise each week        Dispo: 1 year with me  Signed, Rosaline Bane, NP-C "

## 2024-10-22 ENCOUNTER — Ambulatory Visit (INDEPENDENT_AMBULATORY_CARE_PROVIDER_SITE_OTHER): Admitting: Nurse Practitioner

## 2024-10-22 ENCOUNTER — Encounter (HOSPITAL_BASED_OUTPATIENT_CLINIC_OR_DEPARTMENT_OTHER): Payer: Self-pay | Admitting: Nurse Practitioner

## 2024-10-22 VITALS — BP 118/66 | HR 69 | Ht 63.5 in | Wt 165.5 lb

## 2024-10-22 DIAGNOSIS — F419 Anxiety disorder, unspecified: Secondary | ICD-10-CM | POA: Diagnosis not present

## 2024-10-22 DIAGNOSIS — I493 Ventricular premature depolarization: Secondary | ICD-10-CM

## 2024-10-22 DIAGNOSIS — Z79899 Other long term (current) drug therapy: Secondary | ICD-10-CM | POA: Diagnosis not present

## 2024-10-22 DIAGNOSIS — I350 Nonrheumatic aortic (valve) stenosis: Secondary | ICD-10-CM | POA: Diagnosis not present

## 2024-10-22 DIAGNOSIS — E785 Hyperlipidemia, unspecified: Secondary | ICD-10-CM

## 2024-10-22 DIAGNOSIS — R002 Palpitations: Secondary | ICD-10-CM

## 2024-10-22 DIAGNOSIS — Z7189 Other specified counseling: Secondary | ICD-10-CM

## 2024-10-22 DIAGNOSIS — I10 Essential (primary) hypertension: Secondary | ICD-10-CM

## 2024-10-22 MED ORDER — ALPRAZOLAM 0.5 MG PO TABS: 0.5000 mg | ORAL_TABLET | Freq: Every evening | ORAL | 1 refills | Status: AC | PRN

## 2024-10-22 NOTE — Patient Instructions (Signed)
 Medication Instructions:   START Xanax  Take 1 tablet (0.5 mg total) by mouth at bedtime as needed for anxiety. - Oral      *If you need a refill on your cardiac medications before your next appointment, please call your pharmacy*  Lab Work:  None ordered.  If you have labs (blood work) drawn today and your tests are completely normal, you will receive your results only by: MyChart Message (if you have MyChart) OR A paper copy in the mail If you have any lab test that is abnormal or we need to change your treatment, we will call you to review the results.  Testing/Procedures:  None ordered.   Follow-Up: At [facility], you and your health needs are our priority.  As part of our continuing mission to provide you with exceptional heart care, our providers are all part of one team.  This team includes your primary Cardiologist (physician) and Advanced Practice Providers or APPs (Physician Assistants and Nurse Practitioners) who all work together to provide you with the care you need, when you need it.  Your next appointment:   1 year(s)  Provider:   Rosaline Bane, NP    We recommend signing up for the patient portal called MyChart.  Sign up information is provided on this After Visit Summary.  MyChart is used to connect with patients for Virtual Visits (Telemedicine).  Patients are able to view lab/test results, encounter notes, upcoming appointments, etc.  Non-urgent messages can be sent to your provider as well.   To learn more about what you can do with MyChart, go to [email or web address].   Other Instructions  Your physician wants you to follow-up in: 1 year.  You will receive a reminder letter in the mail two months in advance. If you don't receive a letter, please call our office to schedule the follow-up appointment.

## 2024-10-31 ENCOUNTER — Other Ambulatory Visit (HOSPITAL_BASED_OUTPATIENT_CLINIC_OR_DEPARTMENT_OTHER): Payer: Self-pay | Admitting: *Deleted

## 2024-10-31 DIAGNOSIS — I10 Essential (primary) hypertension: Secondary | ICD-10-CM

## 2024-10-31 DIAGNOSIS — R002 Palpitations: Secondary | ICD-10-CM

## 2024-10-31 DIAGNOSIS — E785 Hyperlipidemia, unspecified: Secondary | ICD-10-CM

## 2024-10-31 DIAGNOSIS — Z79899 Other long term (current) drug therapy: Secondary | ICD-10-CM

## 2024-10-31 DIAGNOSIS — I350 Nonrheumatic aortic (valve) stenosis: Secondary | ICD-10-CM

## 2024-10-31 DIAGNOSIS — I493 Ventricular premature depolarization: Secondary | ICD-10-CM

## 2024-10-31 LAB — LIPID PANEL
Chol/HDL Ratio: 3.2 ratio (ref 0.0–4.4)
Cholesterol, Total: 133 mg/dL (ref 100–199)
HDL: 42 mg/dL
LDL Chol Calc (NIH): 72 mg/dL (ref 0–99)
Triglycerides: 103 mg/dL (ref 0–149)
VLDL Cholesterol Cal: 19 mg/dL (ref 5–40)

## 2024-11-01 ENCOUNTER — Ambulatory Visit (HOSPITAL_BASED_OUTPATIENT_CLINIC_OR_DEPARTMENT_OTHER): Payer: Self-pay | Admitting: Nurse Practitioner
